# Patient Record
Sex: Male | Born: 1965 | Race: White | Hispanic: No | Marital: Married | Smoking: Never smoker
Health system: Southern US, Community
[De-identification: ages and names within clinical notes are randomized; demographics above are authoritative.]

## PROBLEM LIST (undated history)

## (undated) DIAGNOSIS — R112 Nausea with vomiting, unspecified: Secondary | ICD-10-CM

## (undated) DIAGNOSIS — Z9889 Other specified postprocedural states: Secondary | ICD-10-CM

## (undated) DIAGNOSIS — T7840XA Allergy, unspecified, initial encounter: Secondary | ICD-10-CM

## (undated) HISTORY — DX: Allergy, unspecified, initial encounter: T78.40XA

## (undated) HISTORY — PX: HERNIA REPAIR: SHX51

## (undated) HISTORY — PX: SHOULDER SURGERY: SHX246

## (undated) HISTORY — PX: APPENDECTOMY: SHX54

## (undated) HISTORY — PX: KNEE SURGERY: SHX244

---

## 1997-12-21 ENCOUNTER — Ambulatory Visit (HOSPITAL_COMMUNITY): Admission: RE | Admit: 1997-12-21 | Discharge: 1997-12-21 | Payer: Self-pay | Admitting: Dermatology

## 2000-09-10 ENCOUNTER — Ambulatory Visit (HOSPITAL_COMMUNITY): Admission: RE | Admit: 2000-09-10 | Discharge: 2000-09-10 | Payer: Self-pay | Admitting: Surgery

## 2000-10-30 ENCOUNTER — Encounter: Admission: RE | Admit: 2000-10-30 | Discharge: 2000-10-30 | Payer: Self-pay | Admitting: Infectious Diseases

## 2000-11-01 ENCOUNTER — Encounter: Payer: Self-pay | Admitting: Infectious Diseases

## 2000-11-01 ENCOUNTER — Ambulatory Visit (HOSPITAL_COMMUNITY): Admission: RE | Admit: 2000-11-01 | Discharge: 2000-11-01 | Payer: Self-pay | Admitting: Infectious Diseases

## 2000-11-27 ENCOUNTER — Encounter: Admission: RE | Admit: 2000-11-27 | Discharge: 2000-11-27 | Payer: Self-pay | Admitting: Infectious Diseases

## 2001-07-16 ENCOUNTER — Ambulatory Visit (HOSPITAL_COMMUNITY): Admission: RE | Admit: 2001-07-16 | Discharge: 2001-07-16 | Payer: Self-pay | Admitting: Gastroenterology

## 2012-10-09 ENCOUNTER — Ambulatory Visit: Payer: BC Managed Care – PPO

## 2012-10-09 ENCOUNTER — Ambulatory Visit (INDEPENDENT_AMBULATORY_CARE_PROVIDER_SITE_OTHER): Payer: BC Managed Care – PPO | Admitting: Emergency Medicine

## 2012-10-09 VITALS — BP 130/80 | HR 60 | Temp 98.1°F | Resp 18 | Ht 67.5 in | Wt 168.9 lb

## 2012-10-09 DIAGNOSIS — S139XXA Sprain of joints and ligaments of unspecified parts of neck, initial encounter: Secondary | ICD-10-CM

## 2012-10-09 DIAGNOSIS — S8002XA Contusion of left knee, initial encounter: Secondary | ICD-10-CM

## 2012-10-09 DIAGNOSIS — M25572 Pain in left ankle and joints of left foot: Secondary | ICD-10-CM

## 2012-10-09 DIAGNOSIS — S8000XA Contusion of unspecified knee, initial encounter: Secondary | ICD-10-CM

## 2012-10-09 DIAGNOSIS — M25579 Pain in unspecified ankle and joints of unspecified foot: Secondary | ICD-10-CM

## 2012-10-09 DIAGNOSIS — S335XXA Sprain of ligaments of lumbar spine, initial encounter: Secondary | ICD-10-CM

## 2012-10-09 MED ORDER — NAPROXEN SODIUM 550 MG PO TABS: 550.0000 mg | ORAL_TABLET | Freq: Two times a day (BID) | ORAL | Status: DC

## 2012-10-09 MED ORDER — CYCLOBENZAPRINE HCL 10 MG PO TABS: 10.0000 mg | ORAL_TABLET | Freq: Three times a day (TID) | ORAL | Status: DC | PRN

## 2012-10-09 NOTE — Patient Instructions (Addendum)
Knee Sprain A knee sprain is a tear in one of the strong, fibrous tissues that connect the bones (ligaments) in your knee. The severity of the sprain depends on how much of the ligament is torn. The tear can be either partial or complete. CAUSES  Often, sprains are a result of a fall or injury. The force of the impact causes the fibers of your ligament to stretch too much. This excess tension causes the fibers of your ligament to tear. SYMPTOMS  You may have some loss of motion in your knee. Other symptoms include:  Bruising.  Tenderness.  Swelling. DIAGNOSIS  In order to diagnose knee sprain, your caregiver will physically examine your knee to determine how torn the ligament is. Your caregiver may also suggest an X-ray exam of your knee to make sure no bones are broken. TREATMENT  If your ligament is only partially torn, treatment usually involves keeping the knee in a fixed position (immobilization) or bracing your knee for activities that require movement for several weeks. To do this, your caregiver will apply a bandage, cast, or splint to keep your knee from moving or support your knee during movement until it heals. For a partially torn ligament, the healing process usually takes 4 to 6 weeks. If your ligament is completely torn, depending on which ligament it is, you may need surgery to reconnect the ligament to the bone or reconstruct it. After surgery, a cast or splint may be applied and will need to stay on your knee for 4 to 6 weeks while your ligament heals. HOME CARE INSTRUCTIONS  Keep your injured knee elevated to decrease swelling.  To ease pain and swelling, apply ice to your knee twice a day, for 2 to 3 days:  Put ice in a plastic bag.  Place a towel between your skin and the bag.  Leave the ice on for 15 minutes.  Only take over-the-counter or prescription medicine for pain as directed by your caregiver.  Do not leave your knee unprotected until pain and stiffness go  away (usually 4 to 6 weeks).  Do not allow your cast or splint to get wet. If you have been instructed not to remove it, cover your cast or splint with a plastic bag when you shower or bathe. Do not swim.  Your caregiver may suggest exercises for you to do during your recovery to prevent or limit permanent weakness and stiffness. SEEK IMMEDIATE MEDICAL CARE IF:  Your cast or splint becomes damaged.  Your pain becomes worse. MAKE SURE YOU:  Understand these instructions.  Will watch your condition.  Will get help right away if you are not doing well or get worse. Document Released: 02/19/2005 Document Revised: 05/14/2011 Document Reviewed: 02/03/2011 [facility] Patient Information 2014 [place], [place]. Lumbosacral Strain Lumbosacral strain is one of the most common causes of back pain. There are many causes of back pain. Most are not serious conditions. CAUSES  Your backbone (spinal column) is made up of 24 main vertebral bodies, the sacrum, and the coccyx. These are held together by muscles and tough, fibrous tissue (ligaments). Nerve roots pass through the openings between the vertebrae. A sudden move or injury to the back may cause injury to, or pressure on, these nerves. This may result in localized back pain or pain movement (radiation) into the buttocks, down the leg, and into the foot. Sharp, shooting pain from the buttock down the back of the leg (sciatica) is frequently associated with a ruptured (herniated) disk. Pain  may be caused by muscle spasm alone. Your caregiver can often find the cause of your pain by the details of your symptoms and an exam. In some cases, you may need tests (such as X-rays). Your caregiver will work with you to decide if any tests are needed based on your specific exam. HOME CARE INSTRUCTIONS   Avoid an underactive lifestyle. Active exercise, as directed by your caregiver, is your greatest weapon against back pain.  Avoid hard physical activities (tennis,  racquetball, waterskiing) if you are not in proper physical condition for it. This may aggravate or create problems.  If you have a back problem, avoid sports requiring sudden body movements. Swimming and walking are generally safer activities.  Maintain good posture.  Avoid becoming overweight (obese).  Use bed rest for only the most extreme, sudden (acute) episode. Your caregiver will help you determine how much bed rest is necessary.  For acute conditions, you may put ice on the injured area.  Put ice in a plastic bag.  Place a towel between your skin and the bag.  Leave the ice on for 15-20 minutes at a time, every 2 hours, or as needed.  After you are improved and more active, it may help to apply heat for 30 minutes before activities. See your caregiver if you are having pain that lasts longer than expected. Your caregiver can advise appropriate exercises or therapy if needed. With conditioning, most back problems can be avoided. SEEK IMMEDIATE MEDICAL CARE IF:   You have numbness, tingling, weakness, or problems with the use of your arms or legs.  You experience severe back pain not relieved with medicines.  There is a change in bowel or bladder control.  You have increasing pain in any area of the body, including your belly (abdomen).  You notice shortness of breath, dizziness, or feel faint.  You feel sick to your stomach (nauseous), are throwing up (vomiting), or become sweaty.  You notice discoloration of your toes or legs, or your feet get very cold.  Your back pain is getting worse.  You have a fever. MAKE SURE YOU:   Understand these instructions.  Will watch your condition.  Will get help right away if you are not doing well or get worse. Document Released: 11/29/2004 Document Revised: 05/14/2011 Document Reviewed: 05/21/2008 [facility] Patient Information 2014 [place], [place]. Cervical Sprain A cervical sprain is an injury in the neck in which the  ligaments are stretched or torn. The ligaments are the tissues that hold the bones of the neck (vertebrae) in place.Cervical sprains can range from very mild to very severe. Most cervical sprains get better in 1 to 3 weeks, but it depends on the cause and extent of the injury. Severe cervical sprains can cause the neck vertebrae to be unstable. This can lead to damage of the spinal cord and can result in serious nervous system problems. Your caregiver will determine whether your cervical sprain is mild or severe. CAUSES  Severe cervical sprains may be caused by:  Contact sport injuries (football, rugby, wrestling, hockey, auto racing, gymnastics, diving, martial arts, boxing).  Motor vehicle collisions.  Whiplash injuries. This means the neck is forcefully whipped backward and forward.  Falls. Mild cervical sprains may be caused by:   Awkward positions, such as cradling a telephone between your ear and shoulder.  Sitting in a chair that does not offer proper support.  Working at a poorly Marketing executive station.  Activities that require looking up or down for  long periods of time. SYMPTOMS   Pain, soreness, stiffness, or a burning sensation in the front, back, or sides of the neck. This discomfort may develop immediately after injury or it may develop slowly and not begin for 24 hours or more after an injury.  Pain or tenderness directly in the middle of the back of the neck.  Shoulder or upper back pain.  Limited ability to move the neck.  Headache.  Dizziness.  Weakness, numbness, or tingling in the hands or arms.  Muscle spasms.  Difficulty swallowing or chewing.  Tenderness and swelling of the neck. DIAGNOSIS  Most of the time, your caregiver can diagnose this problem by taking your history and doing a physical exam. Your caregiver will ask about any known problems, such as arthritis in the neck or a previous neck injury. X-rays may be taken to find out if there are  any other problems, such as problems with the bones of the neck. However, an X-ray often does not reveal the full extent of a cervical sprain. Other tests such as a computed tomography (CT) scan or magnetic resonance imaging (MRI) may be needed. TREATMENT  Treatment depends on the severity of the cervical sprain. Mild sprains can be treated with rest, keeping the neck in place (immobilization), and pain medicines. Severe cervical sprains need immediate immobilization and an appointment with an orthopedist or neurosurgeon. Several treatment options are available to help with pain, muscle spasms, and other symptoms. Your caregiver may prescribe:  Medicines, such as pain relievers, numbing medicines, or muscle relaxants.  Physical therapy. This can include stretching exercises, strengthening exercises, and posture training. Exercises and improved posture can help stabilize the neck, strengthen muscles, and help stop symptoms from returning.  A neck collar to be worn for short periods of time. Often, these collars are worn for comfort. However, certain collars may be worn to protect the neck and prevent further worsening of a serious cervical sprain. HOME CARE INSTRUCTIONS   Put ice on the injured area.  Put ice in a plastic bag.  Place a towel between your skin and the bag.  Leave the ice on for 15-20 minutes, 3-4 times a day.  Only take over-the-counter or prescription medicines for pain, discomfort, or fever as directed by your caregiver.  Keep all follow-up appointments as directed by your caregiver.  Keep all physical therapy appointments as directed by your caregiver.  If a neck collar is prescribed, wear it as directed by your caregiver.  Do not drive while wearing a neck collar.  Make any needed adjustments to your work station to promote good posture.  Avoid positions and activities that make your symptoms worse.  Warm up and stretch before being active to help prevent  problems. SEEK MEDICAL CARE IF:   Your pain is not controlled with medicine.  You are unable to decrease your pain medicine over time as planned.  Your activity level is not improving as expected. SEEK IMMEDIATE MEDICAL CARE IF:   You develop any bleeding, stomach upset, or signs of an allergic reaction to your medicine.  Your symptoms get worse.  You develop new, unexplained symptoms.  You have numbness, tingling, weakness, or paralysis in any part of your body. MAKE SURE YOU:   Understand these instructions.  Will watch your condition.  Will get help right away if you are not doing well or get worse. Document Released: 12/17/2006 Document Revised: 05/14/2011 Document Reviewed: 11/22/2010 [facility] Patient Information 2014 [place], [place].
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
## 2013-04-24 ENCOUNTER — Observation Stay (HOSPITAL_COMMUNITY)
Admission: EM | Admit: 2013-04-24 | Discharge: 2013-04-25 | Disposition: A | Discharge status: Home / Self Care | Payer: BC Managed Care – PPO | Attending: Surgery | Admitting: Surgery

## 2013-04-24 ENCOUNTER — Encounter (HOSPITAL_COMMUNITY): Payer: Self-pay | Admitting: Emergency Medicine

## 2013-04-24 ENCOUNTER — Ambulatory Visit (HOSPITAL_COMMUNITY)
Admission: RE | Admit: 2013-04-24 | Discharge: 2013-04-24 | Disposition: A | Discharge status: Home / Self Care | Payer: BC Managed Care – PPO | Source: Ambulatory Visit | Attending: Family Medicine | Admitting: Family Medicine

## 2013-04-24 ENCOUNTER — Ambulatory Visit (INDEPENDENT_AMBULATORY_CARE_PROVIDER_SITE_OTHER): Payer: BC Managed Care – PPO | Admitting: Family Medicine

## 2013-04-24 ENCOUNTER — Encounter (HOSPITAL_COMMUNITY): Payer: BC Managed Care – PPO | Admitting: Certified Registered Nurse Anesthetist

## 2013-04-24 ENCOUNTER — Encounter (HOSPITAL_COMMUNITY)
Admission: EM | Disposition: A | Discharge status: Home / Self Care | Payer: Self-pay | Source: Home / Self Care | Attending: Emergency Medicine

## 2013-04-24 ENCOUNTER — Emergency Department (HOSPITAL_COMMUNITY): Payer: BC Managed Care – PPO | Admitting: Certified Registered Nurse Anesthetist

## 2013-04-24 VITALS — BP 126/78 | HR 82 | Temp 99.3°F | Resp 16 | Ht 68.0 in | Wt 168.0 lb

## 2013-04-24 DIAGNOSIS — Q619 Cystic kidney disease, unspecified: Secondary | ICD-10-CM | POA: Insufficient documentation

## 2013-04-24 DIAGNOSIS — R61 Generalized hyperhidrosis: Secondary | ICD-10-CM | POA: Insufficient documentation

## 2013-04-24 DIAGNOSIS — R1031 Right lower quadrant pain: Secondary | ICD-10-CM

## 2013-04-24 DIAGNOSIS — R110 Nausea: Secondary | ICD-10-CM | POA: Insufficient documentation

## 2013-04-24 DIAGNOSIS — Z9049 Acquired absence of other specified parts of digestive tract: Secondary | ICD-10-CM

## 2013-04-24 DIAGNOSIS — K37 Unspecified appendicitis: Secondary | ICD-10-CM

## 2013-04-24 DIAGNOSIS — R509 Fever, unspecified: Secondary | ICD-10-CM

## 2013-04-24 DIAGNOSIS — K3580 Unspecified acute appendicitis: Principal | ICD-10-CM | POA: Diagnosis present

## 2013-04-24 DIAGNOSIS — R630 Anorexia: Secondary | ICD-10-CM

## 2013-04-24 DIAGNOSIS — M47817 Spondylosis without myelopathy or radiculopathy, lumbosacral region: Secondary | ICD-10-CM | POA: Insufficient documentation

## 2013-04-24 DIAGNOSIS — F17200 Nicotine dependence, unspecified, uncomplicated: Secondary | ICD-10-CM | POA: Insufficient documentation

## 2013-04-24 DIAGNOSIS — Z880 Allergy status to penicillin: Secondary | ICD-10-CM | POA: Insufficient documentation

## 2013-04-24 HISTORY — PX: LAPAROSCOPIC APPENDECTOMY: SHX408

## 2013-04-24 HISTORY — DX: Nausea with vomiting, unspecified: R11.2

## 2013-04-24 HISTORY — DX: Other specified postprocedural states: Z98.890

## 2013-04-24 LAB — POCT CBC
GRANULOCYTE PERCENT: 82 % — AB (ref 37–80)
HEMATOCRIT: 53.9 % — AB (ref 43.5–53.7)
HEMOGLOBIN: 17.1 g/dL (ref 14.1–18.1)
Lymph, poc: 2.1 (ref 0.6–3.4)
MCH, POC: 30.8 pg (ref 27–31.2)
MCHC: 31.7 g/dL — AB (ref 31.8–35.4)
MCV: 96.9 fL (ref 80–97)
MID (cbc): 0.7 (ref 0–0.9)
MPV: 8.8 fL (ref 0–99.8)
POC GRANULOCYTE: 12.8 — AB (ref 2–6.9)
POC LYMPH %: 13.3 % (ref 10–50)
POC MID %: 4.7 %M (ref 0–12)
Platelet Count, POC: 227 10*3/uL (ref 142–424)
RBC: 5.56 M/uL (ref 4.69–6.13)
RDW, POC: 13.3 %
WBC: 15.6 10*3/uL — AB (ref 4.6–10.2)

## 2013-04-24 LAB — CBC WITH DIFFERENTIAL/PLATELET
BASOS ABS: 0 10*3/uL (ref 0.0–0.1)
BASOS PCT: 0 % (ref 0–1)
EOS PCT: 0 % (ref 0–5)
Eosinophils Absolute: 0 10*3/uL (ref 0.0–0.7)
HCT: 46 % (ref 39.0–52.0)
Hemoglobin: 16.1 g/dL (ref 13.0–17.0)
Lymphocytes Relative: 13 % (ref 12–46)
Lymphs Abs: 1.8 10*3/uL (ref 0.7–4.0)
MCH: 31.1 pg (ref 26.0–34.0)
MCHC: 35 g/dL (ref 30.0–36.0)
MCV: 88.8 fL (ref 78.0–100.0)
Monocytes Absolute: 1.2 10*3/uL — ABNORMAL HIGH (ref 0.1–1.0)
Monocytes Relative: 9 % (ref 3–12)
NEUTROS ABS: 11 10*3/uL — AB (ref 1.7–7.7)
Neutrophils Relative %: 78 % — ABNORMAL HIGH (ref 43–77)
PLATELETS: 182 10*3/uL (ref 150–400)
RBC: 5.18 MIL/uL (ref 4.22–5.81)
RDW: 12.2 % (ref 11.5–15.5)
WBC: 14 10*3/uL — ABNORMAL HIGH (ref 4.0–10.5)

## 2013-04-24 LAB — POCT UA - MICROSCOPIC ONLY
BACTERIA, U MICROSCOPIC: NEGATIVE
CASTS, UR, LPF, POC: NEGATIVE
CRYSTALS, UR, HPF, POC: NEGATIVE
MUCUS UA: POSITIVE
Yeast, UA: NEGATIVE

## 2013-04-24 LAB — POCT URINALYSIS DIPSTICK
Glucose, UA: NEGATIVE
Leukocytes, UA: NEGATIVE
Nitrite, UA: NEGATIVE
PH UA: 6.5
PROTEIN UA: 30
SPEC GRAV UA: 1.025
Urobilinogen, UA: 0.2

## 2013-04-24 LAB — BASIC METABOLIC PANEL
BUN: 10 mg/dL (ref 6–23)
CALCIUM: 9.4 mg/dL (ref 8.4–10.5)
CHLORIDE: 94 meq/L — AB (ref 96–112)
CO2: 22 mEq/L (ref 19–32)
Creatinine, Ser: 0.72 mg/dL (ref 0.50–1.35)
GFR calc non Af Amer: >90 mL/min (ref >90)
Glucose, Bld: 103 mg/dL — ABNORMAL HIGH (ref 70–99)
Potassium: 3.9 mEq/L (ref 3.7–5.3)
Sodium: 133 mEq/L — ABNORMAL LOW (ref 137–147)

## 2013-04-24 SURGERY — APPENDECTOMY, LAPAROSCOPIC
Anesthesia: General | Site: Abdomen

## 2013-04-24 MED ORDER — HYDROMORPHONE HCL PF 1 MG/ML IJ SOLN
INTRAMUSCULAR | Status: AC
  Filled 2013-04-24: qty 1

## 2013-04-24 MED ORDER — ONDANSETRON HCL 4 MG/2ML IJ SOLN
INTRAMUSCULAR | Status: AC
  Filled 2013-04-24: qty 2

## 2013-04-24 MED ORDER — IOHEXOL 300 MG/ML  SOLN
100.0000 mL | Freq: Once | INTRAMUSCULAR | Status: AC | PRN
  Administered 2013-04-24: 100 mL via INTRAVENOUS

## 2013-04-24 MED ORDER — NEOSTIGMINE METHYLSULFATE 1 MG/ML IJ SOLN
INTRAMUSCULAR | Status: DC | PRN
  Administered 2013-04-24: 5 mg via INTRAVENOUS

## 2013-04-24 MED ORDER — INFLUENZA VAC SPLIT QUAD 0.5 ML IM SUSP: 0.5000 mL | INTRAMUSCULAR | Status: DC

## 2013-04-24 MED ORDER — MIDAZOLAM HCL 5 MG/5ML IJ SOLN
INTRAMUSCULAR | Status: DC | PRN
  Administered 2013-04-24: 2 mg via INTRAVENOUS

## 2013-04-24 MED ORDER — GLYCOPYRROLATE 0.2 MG/ML IJ SOLN
INTRAMUSCULAR | Status: AC
  Filled 2013-04-24: qty 3

## 2013-04-24 MED ORDER — ONDANSETRON HCL 4 MG/2ML IJ SOLN
INTRAMUSCULAR | Status: DC | PRN
  Administered 2013-04-24: 4 mg via INTRAVENOUS

## 2013-04-24 MED ORDER — HYDROMORPHONE HCL PF 1 MG/ML IJ SOLN
0.2500 mg | INTRAMUSCULAR | Status: DC | PRN
  Administered 2013-04-24: 0.5 mg via INTRAVENOUS

## 2013-04-24 MED ORDER — PROPOFOL 10 MG/ML IV BOLUS
INTRAVENOUS | Status: DC | PRN
  Administered 2013-04-24: 150 mg via INTRAVENOUS

## 2013-04-24 MED ORDER — 0.9 % SODIUM CHLORIDE (POUR BTL) OPTIME
TOPICAL | Status: DC | PRN
  Administered 2013-04-24: 1000 mL

## 2013-04-24 MED ORDER — MIDAZOLAM HCL 2 MG/2ML IJ SOLN
INTRAMUSCULAR | Status: AC
  Filled 2013-04-24: qty 2

## 2013-04-24 MED ORDER — DEXAMETHASONE SODIUM PHOSPHATE 10 MG/ML IJ SOLN
INTRAMUSCULAR | Status: AC
  Filled 2013-04-24: qty 1

## 2013-04-24 MED ORDER — ROCURONIUM BROMIDE 100 MG/10ML IV SOLN
INTRAVENOUS | Status: AC
  Filled 2013-04-24: qty 1

## 2013-04-24 MED ORDER — DEXAMETHASONE SODIUM PHOSPHATE 10 MG/ML IJ SOLN
INTRAMUSCULAR | Status: DC | PRN
  Administered 2013-04-24: 10 mg via INTRAVENOUS

## 2013-04-24 MED ORDER — PROPOFOL 10 MG/ML IV BOLUS
INTRAVENOUS | Status: AC
  Filled 2013-04-24: qty 20

## 2013-04-24 MED ORDER — LIDOCAINE HCL (CARDIAC) 20 MG/ML IV SOLN
INTRAVENOUS | Status: DC | PRN
  Administered 2013-04-24: 100 mg via INTRAVENOUS

## 2013-04-24 MED ORDER — FENTANYL CITRATE 0.05 MG/ML IJ SOLN
INTRAMUSCULAR | Status: AC
  Filled 2013-04-24: qty 5

## 2013-04-24 MED ORDER — FENTANYL CITRATE 0.05 MG/ML IJ SOLN
INTRAMUSCULAR | Status: DC | PRN
  Administered 2013-04-24 (×5): 50 ug via INTRAVENOUS

## 2013-04-24 MED ORDER — LACTATED RINGERS IV SOLN
INTRAVENOUS | Status: DC | PRN
  Administered 2013-04-24 (×2): via INTRAVENOUS

## 2013-04-24 MED ORDER — BUPIVACAINE-EPINEPHRINE 0.25% -1:200000 IJ SOLN
INTRAMUSCULAR | Status: DC | PRN
  Administered 2013-04-24: 20 mL

## 2013-04-24 MED ORDER — NEOSTIGMINE METHYLSULFATE 1 MG/ML IJ SOLN
INTRAMUSCULAR | Status: AC
  Filled 2013-04-24: qty 10

## 2013-04-24 MED ORDER — PROMETHAZINE HCL 25 MG/ML IJ SOLN: 6.2500 mg | INTRAMUSCULAR | Status: DC | PRN

## 2013-04-24 MED ORDER — ROCURONIUM BROMIDE 100 MG/10ML IV SOLN
INTRAVENOUS | Status: DC | PRN
  Administered 2013-04-24: 35 mg via INTRAVENOUS

## 2013-04-24 MED ORDER — SUCCINYLCHOLINE CHLORIDE 20 MG/ML IJ SOLN
INTRAMUSCULAR | Status: DC | PRN
  Administered 2013-04-24: 100 mg via INTRAVENOUS

## 2013-04-24 MED ORDER — LACTATED RINGERS IR SOLN
Status: DC | PRN
  Administered 2013-04-24: 1000 mL

## 2013-04-24 MED ORDER — LIDOCAINE HCL (CARDIAC) 20 MG/ML IV SOLN
INTRAVENOUS | Status: AC
  Filled 2013-04-24: qty 5

## 2013-04-24 MED ORDER — BUPIVACAINE-EPINEPHRINE 0.25% -1:200000 IJ SOLN
INTRAMUSCULAR | Status: AC
  Filled 2013-04-24: qty 1

## 2013-04-24 MED ORDER — SODIUM CHLORIDE 0.9 % IV BOLUS (SEPSIS)
1000.0000 mL | Freq: Once | INTRAVENOUS | Status: AC
  Administered 2013-04-24: 1000 mL via INTRAVENOUS

## 2013-04-24 MED ORDER — GLYCOPYRROLATE 0.2 MG/ML IJ SOLN
INTRAMUSCULAR | Status: DC | PRN
Start: 2013-04-24 — End: 2013-04-24
  Administered 2013-04-24: 0.6 mg via INTRAVENOUS

## 2013-04-24 MED ORDER — SODIUM CHLORIDE 0.9 % IV SOLN
1.0000 g | Freq: Once | INTRAVENOUS | Status: AC
  Administered 2013-04-24: 1 g via INTRAVENOUS
  Filled 2013-04-24: qty 1

## 2013-04-24 SURGICAL SUPPLY — 44 items
APL SKNCLS STERI-STRIP NONHPOA (GAUZE/BANDAGES/DRESSINGS) ×1
APPLIER CLIP ROT 10 11.4 M/L (STAPLE)
APR CLP MED LRG 11.4X10 (STAPLE)
BAG SPEC RTRVL LRG 6X4 10 (ENDOMECHANICALS) ×1
BENZOIN TINCTURE PRP APPL 2/3 (GAUZE/BANDAGES/DRESSINGS) ×2 IMPLANT
CANISTER SUCTION 2500CC (MISCELLANEOUS) ×1 IMPLANT
CLIP APPLIE ROT 10 11.4 M/L (STAPLE) IMPLANT
CUTTER FLEX LINEAR 45M (STAPLE) ×1 IMPLANT
DECANTER SPIKE VIAL GLASS SM (MISCELLANEOUS) ×2 IMPLANT
DRAPE LAPAROSCOPIC ABDOMINAL (DRAPES) ×2 IMPLANT
ELECT REM PT RETURN 9FT ADLT (ELECTROSURGICAL) ×2
ELECTRODE REM PT RTRN 9FT ADLT (ELECTROSURGICAL) ×1 IMPLANT
ENDOLOOP SUT PDS II  0 18 (SUTURE)
ENDOLOOP SUT PDS II 0 18 (SUTURE) IMPLANT
GLOVE BIOGEL PI IND STRL 7.0 (GLOVE) ×1 IMPLANT
GLOVE BIOGEL PI INDICATOR 7.0 (GLOVE) ×1
GLOVE SURG ORTHO 8.0 STRL STRW (GLOVE) ×2 IMPLANT
GLOVE SURG SS PI 6.5 STRL IVOR (GLOVE) ×1 IMPLANT
GOWN STRL REUS W/TWL LRG LVL3 (GOWN DISPOSABLE) ×2 IMPLANT
GOWN STRL REUS W/TWL XL LVL3 (GOWN DISPOSABLE) ×3 IMPLANT
IV LACTATED RINGERS 1000ML (IV SOLUTION) ×1 IMPLANT
KIT BASIN OR (CUSTOM PROCEDURE TRAY) ×2 IMPLANT
MANIFOLD NEPTUNE II (INSTRUMENTS) ×1 IMPLANT
NS IRRIG 1000ML POUR BTL (IV SOLUTION) ×1 IMPLANT
PENCIL BUTTON HOLSTER BLD 10FT (ELECTRODE) IMPLANT
POUCH SPECIMEN RETRIEVAL 10MM (ENDOMECHANICALS) ×1 IMPLANT
RELOAD 45 VASCULAR/THIN (ENDOMECHANICALS) IMPLANT
RELOAD STAPLE 45 2.5 WHT GRN (ENDOMECHANICALS) IMPLANT
RELOAD STAPLE 45 3.5 BLU ETS (ENDOMECHANICALS) IMPLANT
RELOAD STAPLE TA45 3.5 REG BLU (ENDOMECHANICALS) ×2 IMPLANT
SCALPEL HARMONIC ACE (MISCELLANEOUS) ×1 IMPLANT
SET IRRIG TUBING LAPAROSCOPIC (IRRIGATION / IRRIGATOR) ×1 IMPLANT
SOLUTION ANTI FOG 6CC (MISCELLANEOUS) ×2 IMPLANT
STRIP CLOSURE SKIN 1/2X4 (GAUZE/BANDAGES/DRESSINGS) ×2 IMPLANT
SUT MNCRL AB 4-0 PS2 18 (SUTURE) ×2 IMPLANT
TOWEL OR 17X26 10 PK STRL BLUE (TOWEL DISPOSABLE) ×2 IMPLANT
TRAY FOLEY CATH 14FRSI W/METER (CATHETERS) ×1 IMPLANT
TRAY FOLEY CATH 16FRSI W/METER (SET/KITS/TRAYS/PACK) ×1 IMPLANT
TRAY LAP CHOLE (CUSTOM PROCEDURE TRAY) ×2 IMPLANT
TROCAR BLADELESS OPT 5 75 (ENDOMECHANICALS) ×2 IMPLANT
TROCAR XCEL BLUNT TIP 100MML (ENDOMECHANICALS) ×2 IMPLANT
TROCAR XCEL NON-BLD 11X100MML (ENDOMECHANICALS) ×2 IMPLANT
TUBING INSUFFLATION 10FT LAP (TUBING) ×2 IMPLANT
WATER STERILE IRR 1500ML POUR (IV SOLUTION) ×1 IMPLANT

## 2013-04-24 NOTE — Progress Notes (Signed)
Subjective: Patient has been having pain in the right lower quadrant of the abdomen since yesterday with no appetite. He has a low-grade temperature. He tried to go to work and felt sick and came on over here. He is maybe a little nauseated but not vomiting. Bowels have not moved since yesterday morning. No burning with urination. His only abdominal surgery was a umbilical herniorrhaphy. No regular medications. He last ate or drank anything at 12 noon he had a pack of crackers  Objective: Throat clear. Neck supple without nodes. Chest clear. Heart regular without murmurs. Abdomen has bowel sounds present. He points to the McBurney's point area as the point of most of his pain. On palpation he the abdomen is soft, guarded in the right lower quadrant. Only minimal rebound. No hernias. Normal male external genitalia. Digital rectal exam reveals prostate gland to be normal and nontender.   Results for orders placed in visit on 04/24/13  POCT URINALYSIS DIPSTICK      Result Value Ref Range   Color, UA dark yellow     Clarity, UA clear     Glucose, UA neg     Bilirubin, UA small     Ketones, UA >=160     Spec Grav, UA 1.025     Blood, UA trace-intact     pH, UA 6.5     Protein, UA 30     Urobilinogen, UA 0.2     Nitrite, UA neg     Leukocytes, UA Negative    POCT UA - MICROSCOPIC ONLY      Result Value Ref Range   WBC, Ur, HPF, POC 0-1     RBC, urine, microscopic 0-1     Bacteria, U Microscopic neg     Mucus, UA pos     Epithelial cells, urine per micros 0-1     Crystals, Ur, HPF, POC neg     Casts, Ur, LPF, POC neg     Yeast, UA neg    POCT CBC      Result Value Ref Range   WBC 15.6 (*) 4.6 - 10.2 K/uL   Lymph, poc 2.1  0.6 - 3.4   POC LYMPH PERCENT 13.3  10 - 50 %L   MID (cbc) 0.7  0 - 0.9   POC MID % 4.7  0 - 12 %M   POC Granulocyte 12.8 (*) 2 - 6.9   Granulocyte percent 82.0 (*) 37 - 80 %G   RBC 5.56  4.69 - 6.13 M/uL   Hemoglobin 17.1  14.1 - 18.1 g/dL   HCT, POC 16.153.9 (*)  09.643.5 - 53.7 %   MCV 96.9  80 - 97 fL   MCH, POC 30.8  27 - 31.2 pg   MCHC 31.7 (*) 31.8 - 35.4 g/dL   RDW, POC 04.513.3     Platelet Count, POC 227  142 - 424 K/uL   MPV 8.8  0 - 99.8 fL   Assessment: Right lower quadrant abdominal pain with low-grade fever, anorexia, elevated white blood count, and exam consistent with appendicitis.  [facility] Will need to go to the emergency room after the CT scan.

## 2013-04-24 NOTE — Patient Instructions (Addendum)
Do not see anything or drink anything between here in the hospital  After the CT scan we will have to have them send you to the emergency room for further evaluation  Appendicitis Appendicitis is when the appendix is swollen (inflamed). The inflammation can lead to developing a hole (perforation) and a collection of pus (abscess). CAUSES  There is not always an obvious cause of appendicitis. Sometimes it is caused by an obstruction in the appendix. The obstruction can be caused by:  A small, hard, pea-sized ball of stool (fecalith).  Enlarged lymph glands in the appendix. SYMPTOMS   Pain around your belly button (navel) that moves toward your lower right belly (abdomen). The pain can become more severe and sharp as time passes.  Tenderness in the lower right abdomen. Pain gets worse if you cough or make a sudden movement.  Feeling sick to your stomach (nauseous).  Throwing up (vomiting).  Loss of appetite.  Fever.  Constipation.  Diarrhea.  Generally not feeling well. DIAGNOSIS   Physical exam.  Blood tests.  Urine test.  X-rays or a CT scan may confirm the diagnosis. TREATMENT  Once the diagnosis of appendicitis is made, the most common treatment is to remove the appendix as soon as possible. This procedure is called appendectomy. In an open appendectomy, a cut (incision) is made in the lower right abdomen and the appendix is removed. In a laparoscopic appendectomy, usually 3 small incisions are made. Long, thin instruments and a camera tube are used to remove the appendix. Most patients go home in 24 to 48 hours after appendectomy. In some situations, the appendix may have already perforated and an abscess may have formed. The abscess may have a "wall" around it as seen on a CT scan. In this case, a drain may be placed into the abscess to remove fluid, and you may be treated with antibiotic medicines that kill germs. The medicine is given through a tube in your vein (IV).  Once the abscess has resolved, it may or may not be necessary to have an appendectomy. You may need to stay in the hospital longer than 48 hours. Document Released: 02/19/2005 Document Revised: 08/21/2011 Document Reviewed: 05/17/2009 [facility] Patient Information 2014 [place].

## 2013-04-24 NOTE — Transfer of Care (Signed)
Immediate Anesthesia Transfer of Care Note  Patient: Mike Olson  Procedure(s) Performed: Procedure(s) (LRB): APPENDECTOMY LAPAROSCOPIC (N/A)  Patient Location: PACU  Anesthesia Type: General  Level of Consciousness: sedated, patient cooperative and responds to stimulation  Airway & Oxygen Therapy: Patient Spontanous Breathing and Patient connected to face mask oxgen  Post-op Assessment: Report given to PACU RN and Post -op Vital signs reviewed and stable  Post vital signs: Reviewed and stable  Complications: No apparent anesthesia complications

## 2013-04-24 NOTE — ED Notes (Signed)
Pt c/o cramping pain in RLQ since last night. Pt had CT done that showed appendicitis. A&Ox4. Pt c/o diarrhea today.

## 2013-04-24 NOTE — ED Notes (Signed)
Bed: RESB Expected date: 04/24/13 Expected time: 8:15 PM Means of arrival:  Comments: Pt from ct +appenditi

## 2013-04-24 NOTE — Op Note (Signed)
OPERATIVE REPORT - LAPAROSCOPIC APPENDECTOMY  Preop diagnosis: Acute appendicitis  Postop diagnosis: Same  Procedure: Laparoscopic appendectomy  Surgeon:  Velora Hecklerodd M. Fanta Wimberley, MD, FACS  Anesthesia: General endotracheal  Estimated blood loss: Minimal  Preparation: Chlora-prep  Complications: None  Indications:  48 yo WM with 2 day history of abdominal pain and anorexia.  WBC 14K.  CT scan consistent with acute appendicitis.  Procedure:  Patient is brought to the operating room and placed in a supine position on the operating room table. Following administration of general anesthesia, a time out was held and the patient's name and type of procedure is confirmed. Patient is then prepped and draped in the usual strict aseptic fashion.  After ascertaining that an adequate level of anesthesia been achieved, a peri-umbilical incision is made with a #15 blade. Dissection is carried down to the fascia. Fascia is incised in the midline and the peritoneal cavity is entered cautiously. A #0-vicryl pursestring suture is placed in the fascia. An Hassan cannula is introduced under direct vision and secured with the pursestring suture. Abdomen is insufflated with carbon dioxide. Laparoscope is introduced and the abdomen is explored. Operative ports are placed in the right upper quadrant and left lower quadrant. Appendix is identified. Mesoappendix is divided with the harmonic scalpel. Dissection is carried down to the base of the appendix. The base of the appendix at its junction with the cecal wall was clearly identified. Using an Endo GIA stapler the base of the appendix is transected at its junction with the cecal wall. There is good approximation of tissue along the staple line. There is good hemostasis along the staple line. The appendix is placed into an endo-catch bag and withdrawn through the umbilical port without difficulty. The #0-vicryl pursestring suture is tied securely.  Right lower quadrant is  irrigated with warm saline which is evacuated. Good hemostasis is noted. Ports are removed under direct vision. Good hemostasis is noted at the port sites. Pneumoperitoneum is released.  Skin incisions are anesthetized with local anesthetic. Wounds are closed with interrupted 4-0 Monocryl subcuticular sutures. Wounds were washed and dried and benzoin and Steri-Strips are applied. Sterile dressings are applied. Patient is awakened from anesthesia and brought to the recovery room. The patient tolerated the procedure well.  Velora Hecklerodd M. Rozelia Catapano, MD, [facility] Office: [phone]

## 2013-04-24 NOTE — H&P (Signed)
Mike Olson is an 48 y.o. male.    General Surgery [facility]  Chief Complaint: abdominal pain, acute appendicitis  HPI: patient is a 48 year old male with a two-day history of abdominal pain. Patient had onset of general abdominal discomfort and anorexia on the day prior to admission. Patient went to work this morning. Pain localized to the right lower quadrant. He developed nausea. Patient had fever and sweats. He presented to his primary care physician. He was sent to the emergency department for CT scan of abdomen and pelvis. White blood cell count was elevated at 14,000. CT scan shows findings consistent with acute appendicitis.  Prior abdominal surgery includes umbilical hernia repair in 2002 by Dr. Rayburn Ma.  Past Medical History  Diagnosis Date  . Allergy   . PONV (postoperative nausea and vomiting)     Past Surgical History  Procedure Laterality Date  . Shoulder surgery    . Knee surgery Left   . Hernia repair      History reviewed. No pertinent family history. Social History:  reports that he has never smoked. His smokeless tobacco use includes Chew. He reports that he drinks alcohol. He reports that he does not use illicit drugs.  Allergies:  Allergies  Allergen Reactions  . Penicillins Other (See Comments)    unknown     (Not in a hospital admission)  Results for orders placed during the hospital encounter of 04/24/13 (from the past 48 hour(s))  CBC WITH DIFFERENTIAL     Status: Abnormal   Collection Time    04/24/13  8:52 PM      Result Value Ref Range   WBC 14.0 (*) 4.0 - 10.5 K/uL   RBC 5.18  4.22 - 5.81 MIL/uL   Hemoglobin 16.1  13.0 - 17.0 g/dL   HCT 16.1  09.6 - 04.5 %   MCV 88.8  78.0 - 100.0 fL   MCH 31.1  26.0 - 34.0 pg   MCHC 35.0  30.0 - 36.0 g/dL   RDW 40.9  81.1 - 91.4 %   Platelets 182  150 - 400 K/uL   Neutrophils Relative % 78 (*) 43 - 77 %   Neutro Abs 11.0 (*) 1.7 - 7.7 K/uL   Lymphocytes Relative 13  12 - 46  %   Lymphs Abs 1.8  0.7 - 4.0 K/uL   Monocytes Relative 9  3 - 12 %   Monocytes Absolute 1.2 (*) 0.1 - 1.0 K/uL   Eosinophils Relative 0  0 - 5 %   Eosinophils Absolute 0.0  0.0 - 0.7 K/uL   Basophils Relative 0  0 - 1 %   Basophils Absolute 0.0  0.0 - 0.1 K/uL   Ct Abdomen Pelvis W Contrast  04/24/2013   CLINICAL DATA:  Right lower quadrant pain.  Fever  EXAM: CT ABDOMEN AND PELVIS WITH CONTRAST  TECHNIQUE: Multidetector CT imaging of the abdomen and pelvis was performed using the standard protocol following bolus administration of intravenous contrast.  CONTRAST:  OMNIPAQUE IOHEXOL 300 MG/ML  SOLN  COMPARISON:  None  FINDINGS: No pleural effusion identified. The lung bases appear clear. No focal liver abnormality. The gallbladder is normal. No biliary dilatation. Normal appearance of the pancreas. The spleen is unremarkable.  The adrenal glands are both normal. Bilateral renal cysts are identified. The urinary bladder appears normal. Prostate gland and seminal vesicles are unremarkable.  Normal caliber of the abdominal aorta. There is no adenopathy identified within the upper abdomen.  No pelvic or inguinal adenopathy.  The stomach is normal. The small bowel loops are within normal limits. Appendix is thickened measuring 13 mm in thickness. Enhancement of the appendiceal wall is noted and there is periappendiceal fat stranding. The colon is unremarkable.  Review of the visualized osseous structures is significant for mild lumbar spondylosis.  IMPRESSION: 1. Examination compatible with acute appendicitis. No perforation or abscess.   Electronically Signed   By: Signa Kellaylor  Stroud M.D.   On: 04/24/2013 20:06    Review of Systems  Constitutional: Positive for fever and diaphoresis. Negative for chills, weight loss and malaise/fatigue.  HENT: Negative.   Eyes: Negative.   Respiratory: Negative.   Cardiovascular: Negative.   Gastrointestinal: Positive for nausea, abdominal pain (right lower  quadrant) and diarrhea. Negative for vomiting.  Genitourinary: Negative.   Musculoskeletal: Negative.   Skin: Negative.   Neurological: Negative.   Endo/Heme/Allergies: Negative.   Psychiatric/Behavioral: Negative.     Blood pressure 147/90, pulse 78, temperature 98.3 F (36.8 C), temperature source Oral, resp. rate 18, SpO2 98.00%. Physical Exam  Constitutional: He is oriented to person, place, and time. He appears well-developed and well-nourished. No distress.  HENT:  Head: Normocephalic and atraumatic.  Right Ear: External ear normal.  Left Ear: External ear normal.  Eyes: Conjunctivae are normal. No scleral icterus.  Neck: Normal range of motion. Neck supple. No thyromegaly present.  Cardiovascular: Normal rate, regular rhythm and normal heart sounds.   No murmur heard. Respiratory: Effort normal and breath sounds normal. He has no wheezes.  GI: Soft. Bowel sounds are normal. He exhibits no distension and no mass. There is tenderness (right lower quadrant). There is guarding. There is no rebound.  Musculoskeletal: Normal range of motion. He exhibits no edema.  Neurological: He is alert and oriented to person, place, and time.  Skin: Skin is warm and dry.  Psychiatric: He has a normal mood and affect. His behavior is normal.     Assessment/Plan Acute appendicitis  Plan laparoscopic appendectomy  The risks and benefits of the procedure have been discussed at length with the patient.  The patient understands the proposed procedure, potential alternative treatments, and the course of recovery to be expected.  All of the patient's questions have been answered at this time.  The patient wishes to proceed with surgery.  Velora Hecklerodd M. Jerek Meulemans, MD, [facility] Office: [phone]    Dina Warbington Judie PetitM 04/24/2013, 9:44 PM

## 2013-04-24 NOTE — ED Provider Notes (Signed)
CSN: [id number]     Arrival date & time 04/24/13  2022 History   First MD Initiated Contact with Patient 04/24/13 2041     Chief Complaint  Patient presents with  . Positive appendicitis     (Consider location/radiation/quality/duration/timing/severity/associated sxs/prior Treatment) HPI Comments: Patient presents with a two-day history of worsening abdominal pain. He's had some nausea but no vomiting. He has not had a known fevers. He's had decreased appetite. He was seen by his primary care physician earlier today and sent over for a CT scan shows appendicitis. His last by mouth intake was at lunchtime other than the contrast for the CT scan.   Past Medical History  Diagnosis Date  . Allergy   . PONV (postoperative nausea and vomiting)    Past Surgical History  Procedure Laterality Date  . Shoulder surgery    . Knee surgery Left   . Hernia repair     Family History  Problem Relation Age of Onset  . Hypertension Father    History  Substance Use Topics  . Smoking status: Never Smoker   . Smokeless tobacco: Current User    Types: Chew  . Alcohol Use: Yes     Comment: " a couple drinks per night"    Review of Systems  Constitutional: Positive for appetite change. Negative for fever, chills, diaphoresis and fatigue.  HENT: Negative for congestion, rhinorrhea and sneezing.   Eyes: Negative.   Respiratory: Negative for cough, chest tightness and shortness of breath.   Cardiovascular: Negative for chest pain and leg swelling.  Gastrointestinal: Positive for nausea and abdominal pain. Negative for vomiting, diarrhea and blood in stool.  Genitourinary: Negative for frequency, hematuria, flank pain and difficulty urinating.  Musculoskeletal: Negative for arthralgias and back pain.  Skin: Negative for rash.  Neurological: Negative for dizziness, speech difficulty, weakness, numbness and headaches.      Allergies  Penicillins  Home Medications   Current Outpatient Rx   Name  Route  Sig  Dispense  Refill  . cyclobenzaprine (FLEXERIL) 10 MG tablet   Oral   Take 1 tablet (10 mg total) by mouth 3 (three) times daily as needed for muscle spasms.   30 tablet   0   . ibuprofen (ADVIL,MOTRIN) 200 MG tablet   Oral   Take 600 mg by mouth every 6 (six) hours as needed (pain).         . ciprofloxacin (CIPRO) 500 MG tablet   Oral   Take 1 tablet (500 mg total) by mouth 2 (two) times daily.   10 tablet   0   . HYDROcodone-acetaminophen (NORCO/VICODIN) 5-325 MG per tablet   Oral   Take 1-2 tablets by mouth every 4 (four) hours as needed for moderate pain.   30 tablet   0    BP 125/79  Pulse 71  Temp(Src) 98.1 F (36.7 C) (Oral)  Resp 18  Ht 5\' 8"  (1.727 m)  Wt 178 lb 9.2 oz (81 kg)  BMI 27.16 kg/m2  SpO2 96% Physical Exam  Constitutional: He is oriented to person, place, and time. He appears well-developed and well-nourished.  HENT:  Head: Normocephalic and atraumatic.  Eyes: Pupils are equal, round, and reactive to light.  Neck: Normal range of motion. Neck supple.  Cardiovascular: Normal rate, regular rhythm and normal heart sounds.   Pulmonary/Chest: Effort normal and breath sounds normal. No respiratory distress. He has no wheezes. He has no rales. He exhibits no tenderness.  Abdominal: Soft. Bowel sounds are  normal. There is tenderness (moderate tenderness to right lower quadrant. No pain to the inguinal or testicular area). There is no rebound and no guarding.  Musculoskeletal: Normal range of motion. He exhibits no edema.  Lymphadenopathy:    He has no cervical adenopathy.  Neurological: He is alert and oriented to person, place, and time.  Skin: Skin is warm and dry. No rash noted.  Psychiatric: He has a normal mood and affect.    ED Course  Procedures (including critical care time) Labs Review Labs Reviewed  CBC WITH DIFFERENTIAL - Abnormal; Notable for the following:    WBC 14.0 (*)    Neutrophils Relative % 78 (*)    Neutro  Abs 11.0 (*)    Monocytes Absolute 1.2 (*)    All other components within normal limits  BASIC METABOLIC PANEL - Abnormal; Notable for the following:    Sodium 133 (*)    Chloride 94 (*)    Glucose, Bld 103 (*)    All other components within normal limits   Imaging Review Ct Abdomen Pelvis W Contrast  04/24/2013   CLINICAL DATA:  Right lower quadrant pain.  Fever  EXAM: CT ABDOMEN AND PELVIS WITH CONTRAST  TECHNIQUE: Multidetector CT imaging of the abdomen and pelvis was performed using the standard protocol following bolus administration of intravenous contrast.  CONTRAST:  100mL OMNIPAQUE IOHEXOL 300 MG/ML  SOLN  COMPARISON:  None  FINDINGS: No pleural effusion identified. The lung bases appear clear. No focal liver abnormality. The gallbladder is normal. No biliary dilatation. Normal appearance of the pancreas. The spleen is unremarkable.  The adrenal glands are both normal. Bilateral renal cysts are identified. The urinary bladder appears normal. Prostate gland and seminal vesicles are unremarkable.  Normal caliber of the abdominal aorta. There is no adenopathy identified within the upper abdomen. No pelvic or inguinal adenopathy.  The stomach is normal. The small bowel loops are within normal limits. Appendix is thickened measuring 13 mm in thickness. Enhancement of the appendiceal wall is noted and there is periappendiceal fat stranding. The colon is unremarkable.  Review of the visualized osseous structures is significant for mild lumbar spondylosis.  IMPRESSION: 1. Examination compatible with acute appendicitis. No perforation or abscess.   Electronically Signed   By: Signa Kellaylor  Stroud M.D.   On: 04/24/2013 20:06      MDM   Final diagnoses:  Appendicitis    Spoke with surgery on call who will see pt and admit for surgery    Rolan BuccoMelanie Tillman Kazmierski, MD 04/25/13 1734

## 2013-04-24 NOTE — Anesthesia Postprocedure Evaluation (Signed)
  Anesthesia Post-op Note  Patient: Mike Olson  Procedure(s) Performed: Procedure(s): APPENDECTOMY LAPAROSCOPIC (N/A)  Patient Location: PACU  Anesthesia Type:General  Level of Consciousness: awake, alert  and oriented  Airway and Oxygen Therapy: Patient Spontanous Breathing and Patient connected to face mask oxygen  Post-op Pain: mild  Post-op Assessment: Post-op Vital signs reviewed, Patient's Cardiovascular Status Stable, Respiratory Function Stable, Patent Airway and No signs of Nausea or vomiting  Post-op Vital Signs: Reviewed and stable  Complications: No apparent anesthesia complications

## 2013-04-24 NOTE — Anesthesia Preprocedure Evaluation (Signed)
Anesthesia Evaluation  Patient identified by MRN, date of birth, ID band Patient awake    Reviewed: Allergy & Precautions, H&P , NPO status , Patient's Chart, lab work & pertinent test results  History of Anesthesia Complications (+) PONV and history of anesthetic complications  Airway Mallampati: II TM Distance: >3 FB Neck ROM: Full    Dental no notable dental hx.    Pulmonary neg pulmonary ROS,  breath sounds clear to auscultation  Pulmonary exam normal       Cardiovascular Exercise Tolerance: Good negative cardio ROS  Rhythm:Regular Rate:Normal     Neuro/Psych negative neurological ROS  negative psych ROS   GI/Hepatic negative GI ROS, Neg liver ROS,   Endo/Other  negative endocrine ROS  Renal/GU negative Renal ROS  negative genitourinary   Musculoskeletal negative musculoskeletal ROS (+)   Abdominal   Peds negative pediatric ROS (+)  Hematology negative hematology ROS (+)   Anesthesia Other Findings   Reproductive/Obstetrics negative OB ROS                           Anesthesia Physical Anesthesia Plan  ASA: I and emergent  Anesthesia Plan: General   Post-op Pain Management:    Induction: Intravenous  Airway Management Planned: Oral ETT  Additional Equipment:   Intra-op Plan:   Post-operative Plan: Extubation in OR  Informed Consent: I have reviewed the patients History and Physical, chart, labs and discussed the procedure including the risks, benefits and alternatives for the proposed anesthesia with the patient or authorized representative who has indicated his/her understanding and acceptance.   Dental advisory given  Plan Discussed with: CRNA  Anesthesia Plan Comments:         Anesthesia Quick Evaluation

## 2013-04-24 NOTE — Preoperative (Signed)
Beta Blockers   Reason not to administer Beta Blockers:Not Applicable 

## 2013-04-25 ENCOUNTER — Encounter (HOSPITAL_COMMUNITY): Payer: Self-pay | Admitting: *Deleted

## 2013-04-25 DIAGNOSIS — Z9049 Acquired absence of other specified parts of digestive tract: Secondary | ICD-10-CM

## 2013-04-25 MED ORDER — HYDROCODONE-ACETAMINOPHEN 5-325 MG PO TABS: 1.0000 | ORAL_TABLET | ORAL | Status: DC | PRN

## 2013-04-25 MED ORDER — SODIUM CHLORIDE 0.9 % IV SOLN
1.0000 g | INTRAVENOUS | Status: DC
  Administered 2013-04-25: 1 g via INTRAVENOUS
  Filled 2013-04-25: qty 1

## 2013-04-25 MED ORDER — HYDROCODONE-ACETAMINOPHEN 5-325 MG PO TABS
1.0000 | ORAL_TABLET | ORAL | Status: DC | PRN
  Administered 2013-04-25: 1 via ORAL
  Filled 2013-04-25: qty 1

## 2013-04-25 MED ORDER — MORPHINE SULFATE 2 MG/ML IJ SOLN
2.0000 mg | INTRAMUSCULAR | Status: DC | PRN
  Administered 2013-04-25 (×2): 2 mg via INTRAVENOUS
  Filled 2013-04-25 (×2): qty 1

## 2013-04-25 MED ORDER — SODIUM CHLORIDE 0.9 % IV SOLN: 1.0000 g | INTRAVENOUS | Status: DC

## 2013-04-25 MED ORDER — ONDANSETRON HCL 4 MG PO TABS: 4.0000 mg | ORAL_TABLET | Freq: Four times a day (QID) | ORAL | Status: DC | PRN

## 2013-04-25 MED ORDER — ONDANSETRON HCL 4 MG/2ML IJ SOLN: 4.0000 mg | Freq: Four times a day (QID) | INTRAMUSCULAR | Status: DC | PRN

## 2013-04-25 MED ORDER — IBUPROFEN 600 MG PO TABS
600.0000 mg | ORAL_TABLET | Freq: Four times a day (QID) | ORAL | Status: DC | PRN
  Filled 2013-04-25: qty 1

## 2013-04-25 MED ORDER — CIPROFLOXACIN HCL 500 MG PO TABS: 500.0000 mg | ORAL_TABLET | Freq: Two times a day (BID) | ORAL | Status: DC

## 2013-04-25 MED ORDER — KCL IN DEXTROSE-NACL 30-5-0.45 MEQ/L-%-% IV SOLN
INTRAVENOUS | Status: DC
  Administered 2013-04-25: 01:00:00 via INTRAVENOUS
  Filled 2013-04-25 (×2): qty 1000

## 2013-04-25 NOTE — Progress Notes (Signed)
Utilization Review completed.  

## 2013-04-25 NOTE — Discharge Instructions (Signed)
Laparoscopic Appendectomy °Care After °Refer to this sheet in the next few weeks. These instructions provide you with information on caring for yourself after your procedure. Your caregiver may also give you more specific instructions. Your treatment has been planned according to current medical practices, but problems sometimes occur. Call your caregiver if you have any problems or questions after your procedure. °HOME CARE INSTRUCTIONS °· Do not drive while taking narcotic pain medicines. °· Use stool softener if you become constipated from your pain medicines. °· Change your bandages (dressings) as directed. °· Keep your wounds clean and dry. You may wash the wounds gently with soap and water. Gently pat the wounds dry with a clean towel. °· Do not take baths, swim, or use hot tubs for 10 days, or as instructed by your caregiver. °· Only take over-the-counter or prescription medicines for pain, discomfort, or fever as directed by your caregiver. °· You may continue your normal diet as directed. °· Do not lift more than 10 pounds (4.5 kg) or play contact sports for 3 weeks, or as directed. °· Slowly increase your activity after surgery. °· Take deep breaths to avoid getting a lung infection (pneumonia). °SEEK MEDICAL CARE IF: °· You have redness, swelling, or increasing pain in your wounds. °· You have pus coming from your wounds. °· You have drainage from a wound that lasts longer than 1 day. °· You notice a bad smell coming from the wounds or dressing. °· Your wound edges break open after stitches (sutures) have been removed. °· You notice increasing pain in the shoulders (shoulder strap areas) or near your shoulder blades. °· You develop dizzy episodes or fainting while standing. °· You develop shortness of breath. °· You develop persistent nausea or vomiting. °· You cannot control your bowel functions or lose your appetite. °· You develop diarrhea. °SEEK IMMEDIATE MEDICAL CARE IF:  °· You have a fever. °· You  develop a rash. °· You have difficulty breathing or sharp pains in your chest. °· You develop any reaction or side effects to medicines given. °MAKE SURE YOU: °· Understand these instructions. °· Will watch your condition. °· Will get help right away if you are not doing well or get worse. °Document Released: 02/19/2005 Document Revised: 05/14/2011 Document Reviewed: 08/29/2010 °ExitCare® Patient Information ©2014 [employer]. ° °

## 2013-04-25 NOTE — Discharge Summary (Signed)
Physician Discharge Summary  Patient ID: Mike Olson MRN: [id number] DOB/AGE: 1965-08-02 48 y.o.  Admit date: 04/24/2013 Discharge date: 04/25/2013  Admission Diagnoses:  appendicitis  Discharge Diagnoses:  same  Active Problems:   S/P laparoscopic appendectomy Feb 2015   Surgery:  Lap appy  Discharged Condition: improved  Hospital Course:   Had surgery.  Kept overnight.  Able to drink.  Ready for discharge.    Consults: none  Significant Diagnostic Studies: none    Discharge Exam: Blood pressure 120/71, pulse 59, temperature 97.6 F (36.4 C), temperature source Oral, resp. rate 18, height 5\' 8"  (1.727 m), weight 178 lb 9.2 oz (81 kg), SpO2 98.00%. Minimal pain.  Wounds ok.   Disposition: Final discharge disposition not confirmed  Discharge Orders   Future Orders Complete By Expires   Call MD for:  persistant nausea and vomiting  As directed    Call MD for:  temperature >100.4  As directed    Diet - low sodium heart healthy  As directed    Discharge instructions  As directed    Comments:     Shower after discharge   Increase activity slowly  As directed        Medication List         ciprofloxacin 500 MG tablet  Commonly known as:  CIPRO  Take 1 tablet (500 mg total) by mouth 2 (two) times daily.     cyclobenzaprine 10 MG tablet  Commonly known as:  FLEXERIL  Take 1 tablet (10 mg total) by mouth 3 (three) times daily as needed for muscle spasms.     HYDROcodone-acetaminophen 5-325 MG per tablet  Commonly known as:  NORCO/VICODIN  Take 1-2 tablets by mouth every 4 (four) hours as needed for moderate pain.     ibuprofen 200 MG tablet  Commonly known as:  ADVIL,MOTRIN  Take 600 mg by mouth every 6 (six) hours as needed (pain).           Follow-up Information   Follow up with Velora HecklerGERKIN,TODD M, MD In 3 weeks.   Specialty:  General Surgery   Contact information:   [address] [place] [address] [phone]        Signed: Valarie MerinoMARTIN,Kaytlan Behrman B 04/25/2013, 8:53 AM

## 2013-04-25 NOTE — Progress Notes (Signed)
Pt able to walk unit. Tolerated lunch--positive for flatus. Good pain relief with po med. Discussed discharge instructions. Able to answer questions about meds, activity limits and when to call md

## 2013-04-27 ENCOUNTER — Encounter (HOSPITAL_COMMUNITY): Payer: Self-pay | Admitting: Surgery

## 2013-04-27 ENCOUNTER — Ambulatory Visit (HOSPITAL_COMMUNITY)
Admission: RE | Admit: 2013-04-27 | Discharge: 2013-04-27 | Disposition: A | Discharge status: Home / Self Care | Payer: BC Managed Care – PPO | Source: Ambulatory Visit | Attending: Surgery | Admitting: Surgery

## 2013-04-27 ENCOUNTER — Telehealth (INDEPENDENT_AMBULATORY_CARE_PROVIDER_SITE_OTHER): Payer: Self-pay | Admitting: General Surgery

## 2013-04-27 ENCOUNTER — Other Ambulatory Visit (INDEPENDENT_AMBULATORY_CARE_PROVIDER_SITE_OTHER): Payer: Self-pay

## 2013-04-27 DIAGNOSIS — Z9049 Acquired absence of other specified parts of digestive tract: Secondary | ICD-10-CM

## 2013-04-27 DIAGNOSIS — M79609 Pain in unspecified limb: Secondary | ICD-10-CM

## 2013-04-27 DIAGNOSIS — R252 Cramp and spasm: Secondary | ICD-10-CM | POA: Insufficient documentation

## 2013-04-27 DIAGNOSIS — Z9089 Acquired absence of other organs: Secondary | ICD-10-CM | POA: Insufficient documentation

## 2013-04-27 NOTE — Telephone Encounter (Signed)
I spoke with pt and informed him that [facility].  He is going to try and find a ride and will head on over.  I instructed him to enter through the [place] and check in at section [address].  He is agreeable at this time.

## 2013-04-27 NOTE — Telephone Encounter (Signed)
Pt of Dr. Gerrit FriendsGerkin, had lap appe and is now home.  He called to report that he woke up today with Lt calf pain and cramping.  Rates pain as 3 (10 point scale.)  Denies fever, swelling or streaking in calf.  Will ask Dr. Magnus IvanBlackman for advice, as Dr. Gerrit FriendsGerkin is not available this afternoon.

## 2013-04-27 NOTE — Progress Notes (Signed)
VASCULAR LAB PRELIMINARY  PRELIMINARY  PRELIMINARY  PRELIMINARY  Left lower extremity venous duplex completed.    Preliminary report:  Left leg is negative for deep and superficial vein thrombosis.     Marleena Shubert, RVT 04/27/2013, 6:52 PM

## 2013-04-27 NOTE — Telephone Encounter (Signed)
He needs an ultrasound of his calf to r/o DVT

## 2013-04-29 NOTE — Addendum Note (Signed)
Addendum created 04/29/13 2045 by Azell DerBruce J Willford Rabideau, MD   Modules edited: Anesthesia Responsible Staff

## 2013-06-10 ENCOUNTER — Encounter (INDEPENDENT_AMBULATORY_CARE_PROVIDER_SITE_OTHER): Payer: Self-pay | Admitting: Surgery

## 2013-06-10 ENCOUNTER — Ambulatory Visit (INDEPENDENT_AMBULATORY_CARE_PROVIDER_SITE_OTHER): Payer: BC Managed Care – PPO | Admitting: Surgery

## 2013-06-10 VITALS — BP 124/80 | HR 75 | Temp 98.4°F | Ht 69.0 in | Wt 174.0 lb

## 2013-06-10 DIAGNOSIS — Z9049 Acquired absence of other specified parts of digestive tract: Secondary | ICD-10-CM

## 2013-06-10 DIAGNOSIS — Z9889 Other specified postprocedural states: Secondary | ICD-10-CM

## 2013-06-10 NOTE — Progress Notes (Signed)
General Surgery [facility]  Chief Complaint  Patient presents with  . Routine Post Op    lap appendectomy 04/24/2013    HISTORY: Patient is a 48 year old male who underwent laparoscopic appendectomy for acute appendicitis on 04/24/2013. Postoperative course has been uneventful. He has returned to work. He is having minimal discomfort.  EXAM: Surgical wounds are well healed. No sign of herniation. No sign of infection. Right lower quadrant is soft and nontender without mass.  IMPRESSION: Status post laparoscopic appendectomy for acute appendicitis  PLAN: Patient is doing well. He will apply topical creams to his incisions.  Patient is unrestricted in his activity at this point. He will return for surgical care as needed.  Velora Hecklerodd M. Dearl Rudden, MD, FACS General & Endocrine Surgery [facility]   Visit Diagnoses: 1. S/P laparoscopic appendectomy Feb 2015

## 2013-06-10 NOTE — Patient Instructions (Signed)
  CARE OF INCISION   Apply cocoa butter/vitamin E cream (Palmer's brand) to your incision 2 - 3 times daily.  Massage cream into incision for one minute with each application.  Use sunscreen (50 SPF or higher) for first 6 months after surgery if area is exposed to sun.  You may alternate Mederma or other scar reducing cream with cocoa butter cream if desired.       Mike Olson M. Mike Brickner, MD, FACS      [facility]      Office: [phone]    

## 2014-01-06 ENCOUNTER — Ambulatory Visit (INDEPENDENT_AMBULATORY_CARE_PROVIDER_SITE_OTHER): Payer: BC Managed Care – PPO | Admitting: Family Medicine

## 2014-01-06 VITALS — BP 136/102 | HR 84 | Temp 99.5°F | Resp 18 | Ht 68.25 in | Wt 171.0 lb

## 2014-01-06 DIAGNOSIS — R059 Cough, unspecified: Secondary | ICD-10-CM

## 2014-01-06 DIAGNOSIS — J069 Acute upper respiratory infection, unspecified: Secondary | ICD-10-CM

## 2014-01-06 DIAGNOSIS — R05 Cough: Secondary | ICD-10-CM

## 2014-01-06 MED ORDER — HYDROCODONE-HOMATROPINE 5-1.5 MG/5ML PO SYRP: 5.0000 mL | ORAL_SOLUTION | ORAL | Status: DC | PRN

## 2014-01-06 MED ORDER — BENZONATATE 100 MG PO CAPS: 100.0000 mg | ORAL_CAPSULE | Freq: Three times a day (TID) | ORAL | Status: DC | PRN

## 2014-01-06 MED ORDER — AZITHROMYCIN 250 MG PO TABS: ORAL_TABLET | ORAL | Status: DC

## 2014-01-06 NOTE — Patient Instructions (Addendum)
Take over-the-counter antihistamine decongestant such as Claritin-D, Allegra-D, or Zyrtec-D  Take the azithromycin (Z-Pak) 2 pills initially, then 1 daily for 4 days  Take the Tessalon (benzonatate) one or 2 pills 3 times daily as needed for cough. This does not tend to cause a lot of drowsiness.  Take the Hycodan cough syrup 1 teaspoon every 4-6 hours when not working or when trying to sleep. It is a good cough suppressant but does cause drowsiness.  Return as needed

## 2014-01-06 NOTE — Progress Notes (Signed)
Subjective: 48 year old man who is been sick for about 5 days with a respiratory tract infection. He has had upper respiratory congestion with drainage from his nose. He has a little pain in his left ear. He's been coughing a lot. He just doesn't feel good. Intermittently it is felt like he was a little hot but has not documented a fever.  Objective: TMs are normal. His throat is minimally erythematous. Nose congested. Chest is clear to auscultation. Heart regular without murmurs. Without nodes.  Assessment: Upper respiratory infection with secondary bronchitis  Plan: Is been going on long enough that I think we'll go ahead and begin him on an antibiotic though it still could be just viral. Treat symptomatically also.

## 2014-09-24 ENCOUNTER — Ambulatory Visit (INDEPENDENT_AMBULATORY_CARE_PROVIDER_SITE_OTHER): Payer: BLUE CROSS/BLUE SHIELD | Admitting: Family Medicine

## 2014-09-24 VITALS — BP 146/84 | HR 68 | Temp 98.4°F | Resp 16 | Ht 69.0 in | Wt 162.0 lb

## 2014-09-24 DIAGNOSIS — B86 Scabies: Secondary | ICD-10-CM

## 2014-09-24 DIAGNOSIS — H109 Unspecified conjunctivitis: Secondary | ICD-10-CM | POA: Diagnosis not present

## 2014-09-24 MED ORDER — PREDNISONE 20 MG PO TABS: ORAL_TABLET | ORAL | Status: DC

## 2014-09-24 MED ORDER — NEOMYCIN-POLYMYXIN-DEXAMETH 3.5-10000-0.1 OP SUSP: 1.0000 [drp] | Freq: Four times a day (QID) | OPHTHALMIC | Status: DC

## 2014-09-24 MED ORDER — IVERMECTIN 3 MG PO TABS: 12.0000 mg | ORAL_TABLET | Freq: Once | ORAL | Status: DC

## 2014-09-24 NOTE — Progress Notes (Signed)
This chart was scribed for Elvina Sidle, MD by Stann Ore, medical scribe at [facility].The patient was seen in exam room 4 and the patient's care was started at 8:28 AM.  Patient ID: Mike Olson MRN: [id number], DOB: Jul 02, 1965, 49 y.o. Date of Encounter: 09/24/2014  Primary Physician: No PCP Per Patient  Chief Complaint:  Chief Complaint  Patient presents with   Poison Ivy    x 3 weeks    HPI:  Mike Olson is a 49 y.o. male who presents to [facility] complaining of rash from possible poison ivy for about 3 weeks.  Pt states that he went to an urgent care when he first noticed it 2 weeks ago. The rash is on his arms, right upper groin, and behind his neck. He was given a cortisone shot and some prednisone. The rash didn't resolve so he took some more prednisone. Then, he informs that the itchiness was relieved for 2-3 days but recently back.  He was working in his garden. He has not noticed his family members having similar symptoms.   His left eye has some irritation. He went to eye doctor within the last year but didn't see anything.   He works as Theatre manager at [employer].   Past Medical History  Diagnosis Date   Allergy    PONV (postoperative nausea and vomiting)      Home Meds: Prior to Admission medications   Medication Sig Start Date End Date Taking? Authorizing Provider  esomeprazole (NEXIUM) 20 MG capsule Take 20 mg by mouth daily at 12 noon.   Yes Historical Provider, MD  ibuprofen (ADVIL,MOTRIN) 200 MG tablet Take 600 mg by mouth every 6 (six) hours as needed (pain).   Yes Historical Provider, MD    Allergies:  Allergies  Allergen Reactions   Penicillins Other (See Comments)    unknown    History   Social History   Marital Status: Married    Spouse Name: N/A   Number of Children: N/A   Years of Education: N/A   Occupational History   Not on file.   Social History Main  Topics   Smoking status: Never Smoker    Smokeless tobacco: Current User    Types: Chew   Alcohol Use: Yes     Comment: " a couple drinks per night"   Drug Use: No   Sexual Activity: Not on file   Other Topics Concern   Not on file   Social History Narrative     Review of Systems: Constitutional: negative for chills, fever, night sweats, weight changes, or fatigue  HEENT: negative for vision changes, hearing loss, congestion, rhinorrhea, ST, epistaxis, or sinus pressure; positive for eye redness, positive for eye irritation Cardiovascular: negative for chest pain or palpitations Respiratory: negative for hemoptysis, wheezing, shortness of breath, or cough Abdominal: negative for abdominal pain, nausea, vomiting, diarrhea, or constipation Dermatological: positive for rash (arms, right upper groin, behind his neck) Neurologic: negative for headache, dizziness, or syncope All other systems reviewed and are otherwise negative with the exception to those above and in the HPI.  Physical Exam: Blood pressure 146/84, pulse 68, temperature 98.4 F (36.9 C), resp. rate 16, height 5\' 9"  (1.753 m), weight 162 lb (73.483 kg), SpO2 97 %., Body mass index is 23.91 kg/(m^2). General: Well developed, well nourished, in no acute distress. Head: Normocephalic, atraumatic, eyes without discharge, sclera non-icteric, nares are without discharge. Patient does have injection  of the left eye with some mild cobblestoning of the inner conjunctiva and normal fundus Bilateral auditory canals clear, TM's are without perforation, pearly grey and translucent with reflective cone of light bilaterally. Oral cavity moist, posterior pharynx without exudate, erythema, peritonsillar abscess, or post nasal drip.  Neck: Supple. No thyromegaly. Full ROM. No lymphadenopathy. Lungs: Clear bilaterally to auscultation without wheezes, rales, or rhonchi. Breathing is unlabored. Heart: RRR with S1 S2. No murmurs, rubs, or  gallops appreciated. Abdomen: Soft, non-tender, non-distended with normoactive bowel sounds. No hepatomegaly. No rebound/guarding. No obvious abdominal masses. Msk:  Strength and tone normal for age. Extremities/Skin: Warm and dry. No clubbing or cyanosis. No edema. Linear tracks along the inside of his arms and forearms as well as the pubic region. Neuro: Alert and oriented X 3. Moves all extremities spontaneously. Gait is normal. CNII-XII grossly in tact. Psych:  Responds to questions appropriately with a normal affect.     ASSESSMENT AND PLAN:  49 y.o. year old male with  This chart was scribed in my presence and reviewed by me personally.    ICD-9-CM ICD-10-CM   1. Scabies 133.0 B86 ivermectin (STROMECTOL) 3 MG TABS tablet     predniSONE (DELTASONE) 20 MG tablet  2. Conjunctivitis of left eye 372.30 H10.9 neomycin-polymyxin b-dexamethasone (MAXITROL) 3.5-10000-0.1 SUSP     Signed, Elvina Sidle, MD    Signed, Elvina Sidle, MD 09/24/2014 8:28 AM

## 2014-09-24 NOTE — Patient Instructions (Signed)
Scabies Scabies are small bugs (mites) that burrow under the skin and cause red bumps and severe itching. These bugs can only be seen with a microscope. Scabies are highly contagious. They can spread easily from person to person by direct contact. They are also spread through sharing clothing or linens that have the scabies mites living in them. It is not unusual for an entire family to become infected through shared towels, clothing, or bedding.  HOME CARE INSTRUCTIONS   Your caregiver may prescribe a cream or lotion to kill the mites. If cream is prescribed, massage the cream into the entire body from the neck to the bottom of both feet. Also massage the cream into the scalp and face if your child is less than 2 year old. Avoid the eyes and mouth. Do not wash your hands after application.  Leave the cream on for 8 to 12 hours. Your child should bathe or shower after the 8 to 12 hour application period. Sometimes it is helpful to apply the cream to your child right before bedtime.  One treatment is usually effective and will eliminate approximately 95% of infestations. For severe cases, your caregiver may decide to repeat the treatment in 1 week. Everyone in your household should be treated with one application of the cream.  New rashes or burrows should not appear within 24 to 48 hours after successful treatment. However, the itching and rash may last for 2 to 4 weeks after successful treatment. Your caregiver may prescribe a medicine to help with the itching or to help the rash go away more quickly.  Scabies can live on clothing or linens for up to 3 days. All of your child's recently used clothing, towels, stuffed toys, and bed linens should be washed in hot water and then dried in a dryer for at least 20 minutes on high heat. Items that cannot be washed should be enclosed in a plastic bag for at least 3 days.  To help relieve itching, bathe your child in a cool bath or apply cool washcloths to the  affected areas.  Your child may return to school after treatment with the prescribed cream. SEEK MEDICAL CARE IF:   The itching persists longer than 4 weeks after treatment.  The rash spreads or becomes infected. Signs of infection include red blisters or yellow-tan crust. Document Released: 02/19/2005 Document Revised: 05/14/2011 Document Reviewed: 06/30/2008 [facility] Patient Information 2015 [place], [place]. This information is not intended to replace advice given to you by your health care provider. Make sure you discuss any questions you have with your health care provider. Bacterial Conjunctivitis Bacterial conjunctivitis, commonly called pink eye, is an inflammation of the clear membrane that covers the white part of the eye (conjunctiva). The inflammation can also happen on the underside of the eyelids. The blood vessels in the conjunctiva become inflamed, causing the eye to become red or pink. Bacterial conjunctivitis may spread easily from one eye to another and from person to person (contagious).  CAUSES  Bacterial conjunctivitis is caused by bacteria. The bacteria may come from your own skin, your upper respiratory tract, or from someone else with bacterial conjunctivitis. SYMPTOMS  The normally white color of the eye or the underside of the eyelid is usually pink or red. The pink eye is usually associated with irritation, tearing, and some sensitivity to light. Bacterial conjunctivitis is often associated with a thick, yellowish discharge from the eye. The discharge may turn into a crust on the eyelids overnight, which causes  your eyelids to stick together. If a discharge is present, there may also be some blurred vision in the affected eye. DIAGNOSIS  Bacterial conjunctivitis is diagnosed by your caregiver through an eye exam and the symptoms that you report. Your caregiver looks for changes in the surface tissues of your eyes, which may point to the specific type of conjunctivitis. A  sample of any discharge may be collected on a cotton-tip swab if you have a severe case of conjunctivitis, if your cornea is affected, or if you keep getting repeat infections that do not respond to treatment. The sample will be sent to a lab to see if the inflammation is caused by a bacterial infection and to see if the infection will respond to antibiotic medicines. TREATMENT   Bacterial conjunctivitis is treated with antibiotics. Antibiotic eyedrops are most often used. However, antibiotic ointments are also available. Antibiotics pills are sometimes used. Artificial tears or eye washes may ease discomfort. HOME CARE INSTRUCTIONS   To ease discomfort, apply a cool, clean washcloth to your eye for 10-20 minutes, 3-4 times a day.  Gently wipe away any drainage from your eye with a warm, wet washcloth or a cotton ball.  Wash your hands often with soap and water. Use paper towels to dry your hands.  Do not share towels or washcloths. This may spread the infection.  Change or wash your pillowcase every day.  You should not use eye makeup until the infection is gone.  Do not operate machinery or drive if your vision is blurred.  Stop using contact lenses. Ask your caregiver how to sterilize or replace your contacts before using them again. This depends on the type of contact lenses that you use.  When applying medicine to the infected eye, do not touch the edge of your eyelid with the eyedrop bottle or ointment tube. SEEK IMMEDIATE MEDICAL CARE IF:   Your infection has not improved within 3 days after beginning treatment.  You had yellow discharge from your eye and it returns.  You have increased eye pain.  Your eye redness is spreading.  Your vision becomes blurred.  You have a fever or persistent symptoms for more than 2-3 days.  You have a fever and your symptoms suddenly get worse.  You have facial pain, redness, or swelling. MAKE SURE YOU:   Understand these  instructions.  Will watch your condition.  Will get help right away if you are not doing well or get worse. Document Released: 02/19/2005 Document Revised: 07/06/2013 Document Reviewed: 07/23/2011 [facility] Patient Information 2015 [place], [place]. This information is not intended to replace advice given to you by your health care provider. Make sure you discuss any questions you have with your health care provider.

## 2014-10-02 ENCOUNTER — Telehealth: Payer: Self-pay

## 2014-10-02 NOTE — Telephone Encounter (Signed)
Pt was wondering if he should be prescribed another prescription for his scabies? He said that its clearing up and just slight itching but he was worried about it possibly coming back.  Please advise [phone]

## 2014-10-04 NOTE — Telephone Encounter (Signed)
Left detailed VM to not repeat ivermectin, use oral antihistamine for itching, and apply moisturizer after bathing.

## 2014-10-04 NOTE — Telephone Encounter (Signed)
He should not repeat the ivermectin at this point.   Advise OTC oral antihistamine for itching (Allegra, Claritin or Zyrtec). Drink plenty of water. Apply a moisturizer to the skin daily after bathing (more often if able).

## 2014-11-19 ENCOUNTER — Ambulatory Visit (INDEPENDENT_AMBULATORY_CARE_PROVIDER_SITE_OTHER): Payer: BLUE CROSS/BLUE SHIELD | Admitting: Physician Assistant

## 2014-11-19 VITALS — BP 118/76 | HR 67 | Temp 98.5°F | Resp 16 | Ht 69.0 in | Wt 164.2 lb

## 2014-11-19 DIAGNOSIS — J069 Acute upper respiratory infection, unspecified: Secondary | ICD-10-CM | POA: Diagnosis not present

## 2014-11-19 DIAGNOSIS — S39012A Strain of muscle, fascia and tendon of lower back, initial encounter: Secondary | ICD-10-CM

## 2014-11-19 DIAGNOSIS — R1012 Left upper quadrant pain: Secondary | ICD-10-CM

## 2014-11-19 DIAGNOSIS — Z23 Encounter for immunization: Secondary | ICD-10-CM

## 2014-11-19 MED ORDER — GUAIFENESIN ER 1200 MG PO TB12: 1.0000 | ORAL_TABLET | Freq: Two times a day (BID) | ORAL | Status: AC

## 2014-11-19 MED ORDER — IPRATROPIUM BROMIDE 0.06 % NA SOLN: 2.0000 | Freq: Three times a day (TID) | NASAL | Status: DC

## 2014-11-19 NOTE — Progress Notes (Signed)
Mike Olson  MRN: [id number] DOB: 1965/10/22  Subjective:  Pt presents to clinic for multiple complaints 1- presents for evaluation of cold symptoms x 3 days. His symptoms began at work 3 days ago with sinus congestion and a scratchy throat. He thought it was allergies but heard that a virus was going around so wanted to get evaluated. No dysphagia. Cough is productive with yellow/dark sputum. He has pressure around his eyes, forehead, and cheeks. His son had similar symptoms, but with vomiting.  2- abdominal pain in the LUQ for which he is concerned about stomach ulcers. He is going through a divorce and worries that stress is giving him a stomach ulcer. Also has a hx of reflux x 2 years and this is the area that hurts him when he has reflux sxs. He takes generic Nexium (esomeprazole) 20 mg daily for reflux. He has the abdominal pain sometimes with spicy foods, and occasionally will have the pain with normal meals that are not spicy. He cannot identify a specific trigger for the pain - states nothing he knows of that makes it better - it only lasts a few mins at max. The episodes occur approximately 2x/week. Bowel movements have no effect on the pain. He describes the pain as irritating and burning, not worse when he lays down. No emesis or hematemesis. No melena or hematochezia.  3- right and left mid- back pain that occurs mostly at night when he is laying down. He has to roll over to the opposite side in order to reduce the pain, once he rolls over the pain resolves. 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 He describes the pain as irritating/tight feeling like he is stiff. No dysuria, urgency, or frequency. No hematuria.  4- interested in receiving the flu vaccine, but is unsure if he can receive it due to being currently ill.   Patient Active Problem List   Diagnosis Date Noted  . S/P laparoscopic appendectomy Feb  2015 04/25/2013    Current Outpatient Prescriptions on File Prior to Visit  Medication Sig Dispense Refill  . esomeprazole (NEXIUM) 20 MG capsule Take 20 mg by mouth daily at 12 noon.    Marland Kitchen ibuprofen (ADVIL,MOTRIN) 200 MG tablet Take 600 mg by mouth every 6 (six) hours as needed (pain).    Marland Kitchen ivermectin (STROMECTOL) 3 MG TABS tablet Take 4 tablets (12 mg total) by mouth once. (Patient not taking: Reported on 11/19/2014) 4 tablet 0  . neomycin-polymyxin b-dexamethasone (MAXITROL) 3.5-10000-0.1 SUSP Place 1 drop into the left eye every 6 (six) hours. (Patient not taking: Reported on 11/19/2014) 5 mL 0  . predniSONE (DELTASONE) 20 MG tablet Two daily with food (Patient not taking: Reported on 11/19/2014) 10 tablet 0   No current facility-administered medications on file prior to visit.    Allergies  Allergen Reactions  . Penicillins Other (See Comments)    unknown    Review of Systems  HENT: Positive for congestion, rhinorrhea and sore throat.   Respiratory: Positive for cough. Negative for shortness of breath and wheezing.        No h/o asthma  Gastrointestinal: Positive for abdominal pain (LUQ - random intermittent pain). Negative for nausea, vomiting, diarrhea, constipation and blood in stool.  Musculoskeletal: Negative for gait problem.  Neurological: Negative for headaches.   Objective:  BP 118/76 mmHg  Pulse 67  Temp(Src) 98.5 F (36.9 C) (Oral)  Resp 16  Ht 5\' 9"  (1.753 m)  Wt 164 lb 3.2 oz (74.481 kg)  BMI 24.24 kg/m2  SpO2 98%  Physical Exam  Constitutional: He is oriented to person, place, and time and well-developed, well-nourished, and in no distress.     HENT:  Head: Normocephalic and atraumatic.  Right Ear: Hearing, tympanic membrane, external ear and ear canal normal.  Left Ear: Hearing, tympanic membrane, external ear and ear canal normal.  Nose: Mucosal edema (red) present.  Mouth/Throat: Uvula is midline, oropharynx is clear and moist and mucous membranes are  normal.  Eyes: Conjunctivae are normal.  Neck: Normal range of motion.  Cardiovascular: Normal rate, regular rhythm and normal heart sounds.   Pulmonary/Chest: Effort normal and breath sounds normal. He has no wheezes.  Abdominal: There is no hepatosplenomegaly. There is generalized tenderness (discomfort). There is no rebound and no CVA tenderness.  Musculoskeletal:       Thoracic back: He exhibits spasm (bilateral paraspinal muscle speasm - TTP). He exhibits normal range of motion, no tenderness and no bony tenderness.       Back:  Lymphadenopathy:       Head (right side): No tonsillar adenopathy present.       Head (left side): No tonsillar adenopathy present.    He has no cervical adenopathy.       Right: No supraclavicular adenopathy present.       Left: No supraclavicular adenopathy present.  Neurological: He is alert and oriented to person, place, and time. He has normal sensation and normal strength. He displays no weakness. No sensory deficit. Gait normal. Gait normal.  Skin: Skin is warm and dry.  Psychiatric: Mood, memory, affect and judgment normal.    Assessment and Plan :  Flu vaccine need - Plan: Flu Vaccine QUAD 36+ mos IM  Acute URI - Plan: ipratropium (ATROVENT) 0.06 % nasal spray, Guaifenesin (MUCINEX MAXIMUM STRENGTH) 1200 MG TB12 - continue symptomatic treatment at this time.  Left upper quadrant pain - suspect gas - discussed with patient other possibilities but none seem likely at this time - he will pay more close attention to his symptoms and what they are related to etc.  Back strain, initial encounter - watchful waiting - ok to use motrin  But pt to be mindful of stomach issues - he should start stretching  Pt is under a lot of stress with his current divorce proceedings that that is affecting sleep which could be contributing to his above symptoms.  Pt suggested to make an appt for a CPE due to multiple concerns today.  Benny Lennert PA-C  [facility] 11/19/2014 5:20 PM

## 2014-11-19 NOTE — Patient Instructions (Signed)
For your cold - symptom treatment - drink a lot of fluids and use motrin or tylenol for aches and pains  For your stomach pain - monitor and keep track of your symptoms to establish a pattern, continue for Nexium and Add OTC Zantac  as needed for pain  For back pain - stretch, core exercises to strength stomach to prevent pressure on back - take motrin as needed for pain  (please monitor whether this affects your stomach pain)

## 2014-11-19 NOTE — Progress Notes (Signed)
Subjective:     Patient ID: Mike Olson, male   DOB: 1965-06-03, 49 y.o.   MRN: [id number] No PCP Per Patient   Chief Complaint  Patient presents with  . Nasal Congestion    x 3 days  . Sore Throat    x 3 days  . Cough    x 3 days  . possible stomach ulcers  . Back Problem    discomfort at night in lower back  . Immunizations    If he is able to the flu vaccine     HPI Patient presents for evaluation of cold symptoms x 3 days. His symptoms began at work 3 days ago with sinus congestion and a scratchy throat. He thought it was allergies but heard that a virus was going around so wanted to get evaluated. No dysphagia. Cough is productive with yellow/dark sputum. He has pressure around his eyes, forehead, and cheeks. His son had similar symptoms, but with vomiting.   Other complaints include abdominal pain in the LUQ for which he is concerned about stomach ulcers. He is going through a divorce and worries that stress is giving him a stomach ulcer. Also has a hx of reflux x 2 years and this is the area that hurts him when he has reflux sxs. He takes generic Nexium (esomeprazole) 20 mg daily for reflux. He has the abdominal pain with spicy foods, and occasionally will have the pain with normal meals that are not spicy. He cannot identify a specific trigger for the pain. The episodes occur approximately 2x/week. Bowel movements have no effect on the pain. He describes the pain as irritating and burning. No emesis or hematemesis. No melena or hematochezia.    He also would like his right and left flank pain addressed. His pain occurs mostly at night when he is laying down. He has to roll over to the opposite side in order to reduce the pain. 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  He describes the pain as irritating. No dysuria, urgency, or frequency. No hematuria.   He is also interested in receiving the flu  vaccine, but is unsure if he can receive it due to being currently ill.  Review of Systems  Constitutional: Positive for fever (Subjective fever). Negative for chills and appetite change.  HENT: Positive for congestion, rhinorrhea, sinus pressure, sneezing and sore throat. Negative for ear pain, hearing loss and trouble swallowing.   Eyes: Negative for discharge and itching.  Respiratory: Positive for cough.   Cardiovascular: Negative for chest pain.  Gastrointestinal: Positive for abdominal pain (Irritation in the LUQ). Negative for nausea, vomiting, diarrhea, constipation and blood in stool.  Genitourinary: Positive for flank pain. Negative for dysuria, urgency, frequency and hematuria.  Musculoskeletal: Positive for back pain.  Neurological: Positive for dizziness (due to blurry vision when looking at things up close).  Psychiatric/Behavioral: Negative for behavioral problems. The patient is not nervous/anxious.     Patient Active Problem List   Diagnosis Date Noted  . S/P laparoscopic appendectomy Feb 2015 04/25/2013    Prior to Admission medications   Medication Sig Start Date End Date Taking? Authorizing Sayler Mickiewicz  esomeprazole (NEXIUM) 20 MG capsule Take 20 mg by mouth daily at 12 noon.   Yes Historical Everlene Cunning, MD  ibuprofen (ADVIL,MOTRIN) 200 MG tablet Take 600 mg by mouth every 6 (six) hours as needed (pain).   Yes Historical Rodney Wigger, MD  ivermectin (STROMECTOL) 3 MG TABS tablet Take 4 tablets (12 mg total) by mouth once. Patient not taking: Reported on 11/19/2014 09/24/14   Elvina Sidle, MD  neomycin-polymyxin b-dexamethasone (MAXITROL) 3.5-10000-0.1 SUSP Place 1 drop into the left eye every 6 (six) hours. Patient not taking: Reported on 11/19/2014 09/24/14   Elvina Sidle, MD  predniSONE (DELTASONE) 20 MG tablet Two daily with food Patient not taking: Reported on 11/19/2014 09/24/14   Elvina Sidle, MD    Allergies  Allergen Reactions  . Penicillins Other (See  Comments)    unknown        Objective:   Physical Exam  Constitutional: He is oriented to person, place, and time. He appears well-developed and well-nourished.  HENT:  Head: Normocephalic and atraumatic.  Right Ear: Tympanic membrane, external ear and ear canal normal.  Left Ear: Tympanic membrane, external ear and ear canal normal.  Nose: Right sinus exhibits no maxillary sinus tenderness and no frontal sinus tenderness. Left sinus exhibits no maxillary sinus tenderness and no frontal sinus tenderness.  Mouth/Throat: Uvula is midline, oropharynx is clear and moist and mucous membranes are normal. No oropharyngeal exudate.  Erythema of the left nasal turbinates  Eyes: Conjunctivae are normal. Pupils are equal, round, and reactive to light.  Neck: Normal range of motion. Neck supple.  Cardiovascular: Normal rate and regular rhythm.   Pulmonary/Chest: Effort normal and breath sounds normal.  Abdominal: Soft. Bowel sounds are normal. He exhibits no distension and no mass. There is generalized tenderness. There is no rebound, no guarding and no CVA tenderness.  Musculoskeletal:       Arms: Distal extremity strength 5/5.  Lymphadenopathy:    He has no cervical adenopathy.  Neurological: He is alert and oriented to person, place, and time.  Skin: Skin is warm and dry.  Psychiatric: He has a normal mood and affect. His behavior is normal. Thought content normal.     BP 118/76 mmHg  Pulse 67  Temp(Src) 98.5 F (36.9 C) (Oral)  Resp 16  Ht 5\' 9"  (1.753 m)  Wt 164 lb 3.2 oz (74.481 kg)  BMI 24.24 kg/m2  SpO2 98%     Assessment & Plan:  1. Flu vaccine need - Flu Vaccine QUAD 36+ mos IM  2. Acute URI Encouraged rest and fluids. Use Motrin or Tylenol as needed for aches and pains.   - ipratropium (ATROVENT) 0.06 % nasal spray; Place 2 sprays into the nose 3 (three) times daily.  Dispense: 15 mL; Refill: 0 - Guaifenesin (MUCINEX MAXIMUM STRENGTH) 1200 MG TB12; Take 1 tablet  (1,200 mg total) by mouth 2 (two) times daily.  Dispense: 14 each; Refill: 0  3. Left upper quadrant pain Unclear etiology. Encouraged patient to monitor symptoms and keep track of any triggers. Continue Nexium and add in OTC Zantac 150 mg as needed for pain.   4. Back strain, initial encounter Encouraged stretching and core exercises. Motrin prn for pain. Patient counseled to monitor if Motrin affects abdominal pain.     Amber D. Race, PA-S Physician Assistant Student [facility]

## 2014-12-30 ENCOUNTER — Ambulatory Visit (INDEPENDENT_AMBULATORY_CARE_PROVIDER_SITE_OTHER): Payer: BLUE CROSS/BLUE SHIELD | Admitting: Physician Assistant

## 2014-12-30 ENCOUNTER — Encounter: Payer: Self-pay | Admitting: Physician Assistant

## 2014-12-30 VITALS — BP 125/92 | HR 65 | Temp 98.1°F | Resp 16 | Ht 69.0 in | Wt 167.2 lb

## 2014-12-30 DIAGNOSIS — R12 Heartburn: Secondary | ICD-10-CM | POA: Insufficient documentation

## 2014-12-30 DIAGNOSIS — Z23 Encounter for immunization: Secondary | ICD-10-CM

## 2014-12-30 DIAGNOSIS — R030 Elevated blood-pressure reading, without diagnosis of hypertension: Secondary | ICD-10-CM | POA: Diagnosis not present

## 2014-12-30 DIAGNOSIS — Z114 Encounter for screening for human immunodeficiency virus [HIV]: Secondary | ICD-10-CM | POA: Diagnosis not present

## 2014-12-30 DIAGNOSIS — Z130 Encounter for screening for diseases of the blood and blood-forming organs and certain disorders involving the immune mechanism: Secondary | ICD-10-CM

## 2014-12-30 DIAGNOSIS — Z1159 Encounter for screening for other viral diseases: Secondary | ICD-10-CM

## 2014-12-30 DIAGNOSIS — Z1329 Encounter for screening for other suspected endocrine disorder: Secondary | ICD-10-CM | POA: Diagnosis not present

## 2014-12-30 DIAGNOSIS — Z13228 Encounter for screening for other metabolic disorders: Secondary | ICD-10-CM

## 2014-12-30 DIAGNOSIS — Z0000 Encounter for general adult medical examination without abnormal findings: Secondary | ICD-10-CM

## 2014-12-30 DIAGNOSIS — IMO0001 Reserved for inherently not codable concepts without codable children: Secondary | ICD-10-CM

## 2014-12-30 DIAGNOSIS — Z13220 Encounter for screening for lipoid disorders: Secondary | ICD-10-CM

## 2014-12-30 DIAGNOSIS — Z113 Encounter for screening for infections with a predominantly sexual mode of transmission: Secondary | ICD-10-CM | POA: Diagnosis not present

## 2014-12-30 LAB — LIPID PANEL
CHOL/HDL RATIO: 3.3 ratio (ref <=5.0)
Cholesterol: 199 mg/dL (ref 125–200)
HDL: 61 mg/dL (ref >=40)
LDL Cholesterol: 118 mg/dL (ref <130)
TRIGLYCERIDES: 99 mg/dL (ref <150)
VLDL: 20 mg/dL (ref <30)

## 2014-12-30 LAB — COMPLETE METABOLIC PANEL WITH GFR
ALBUMIN: 4.4 g/dL (ref 3.6–5.1)
ALK PHOS: 94 U/L (ref 40–115)
ALT: 30 U/L (ref 9–46)
AST: 29 U/L (ref 10–40)
BUN: 10 mg/dL (ref 7–25)
CHLORIDE: 101 mmol/L (ref 98–110)
CO2: 28 mmol/L (ref 20–31)
Calcium: 9.3 mg/dL (ref 8.6–10.3)
Creat: 0.82 mg/dL (ref 0.60–1.35)
Glucose, Bld: 99 mg/dL (ref 65–99)
POTASSIUM: 4.3 mmol/L (ref 3.5–5.3)
SODIUM: 140 mmol/L (ref 135–146)
Total Bilirubin: 1.1 mg/dL (ref 0.2–1.2)
Total Protein: 7.2 g/dL (ref 6.1–8.1)

## 2014-12-30 LAB — HIV ANTIBODY (ROUTINE TESTING W REFLEX): HIV 1&2 Ab, 4th Generation: NONREACTIVE

## 2014-12-30 LAB — CBC WITH DIFFERENTIAL/PLATELET
Basophils Absolute: 0 10*3/uL (ref 0.0–0.1)
Basophils Relative: 0 % (ref 0–1)
EOS ABS: 0.1 10*3/uL (ref 0.0–0.7)
EOS PCT: 1 % (ref 0–5)
HCT: 52.7 % — ABNORMAL HIGH (ref 39.0–52.0)
Hemoglobin: 18.3 g/dL — ABNORMAL HIGH (ref 13.0–17.0)
Lymphocytes Relative: 22 % (ref 12–46)
Lymphs Abs: 1.3 10*3/uL (ref 0.7–4.0)
MCH: 32 pg (ref 26.0–34.0)
MCHC: 34.7 g/dL (ref 30.0–36.0)
MCV: 92.3 fL (ref 78.0–100.0)
MONOS PCT: 8 % (ref 3–12)
MPV: 10.5 fL (ref 8.6–12.4)
Monocytes Absolute: 0.5 10*3/uL (ref 0.1–1.0)
NEUTROS ABS: 4 10*3/uL (ref 1.7–7.7)
Neutrophils Relative %: 69 % (ref 43–77)
PLATELETS: 207 10*3/uL (ref 150–400)
RBC: 5.71 MIL/uL (ref 4.22–5.81)
RDW: 13 % (ref 11.5–15.5)
WBC: 5.8 10*3/uL (ref 4.0–10.5)

## 2014-12-30 LAB — HEPATITIS B SURFACE ANTIGEN: Hepatitis B Surface Ag: NEGATIVE

## 2014-12-30 LAB — HEPATITIS C ANTIBODY: HCV AB: NEGATIVE

## 2014-12-30 LAB — TSH: TSH: 1.748 u[IU]/mL (ref 0.350–4.500)

## 2014-12-30 MED ORDER — ESOMEPRAZOLE MAGNESIUM 40 MG PO CPDR: 40.0000 mg | DELAYED_RELEASE_CAPSULE | Freq: Every day | ORAL | Status: DC

## 2014-12-30 NOTE — Patient Instructions (Addendum)
Keeping you healthy  Get these tests  Blood pressure- Have your blood pressure checked once a year by your healthcare provider.  Normal blood pressure is 120/80.  Weight- Have your body mass index (BMI) calculated to screen for obesity.  BMI is a measure of body fat based on height and weight. You can also calculate your own BMI at https://www.west-esparza.com/www.nhlbisupport.com/bmi/.  Cholesterol- Have your cholesterol checked regularly starting at age 48, sooner may be necessary if you have diabetes, high blood pressure, if a family member developed heart diseases at an early age or if you smoke.   Chlamydia, HIV, and other sexual transmitted disease- Get screened each year until the age of 48 then within three months of each new sexual partner.  Diabetes- Have your blood sugar checked regularly if you have high blood pressure, high cholesterol, a family history of diabetes or if you are overweight.  Get these vaccines  Flu shot- Every fall.  Tetanus shot- Every 10 years.    Take these steps  Don't smoke- If you do smoke, ask your healthcare provider about quitting. For tips on how to quit, go to www.smokefree.gov or call 1-800-QUIT-NOW.  Be physically active- Exercise 5 days a week for at least 30 minutes.  If you are not already physically active start slow and gradually work up to 30 minutes of moderate physical activity.  Examples of moderate activity include walking briskly, mowing the yard, dancing, swimming bicycling, etc.  Eat a healthy diet- Eat a variety of healthy foods such as fruits, vegetables, low fat milk, low fat cheese, yogurt, lean meats, poultry, fish, beans, tofu, etc.  For more information on healthy eating, go to www.thenutritionsource.org  Drink alcohol in moderation- Limit alcohol intake two drinks or less a day.  Never drink and drive.  Dentist- Brush and floss teeth twice daily; visit your dentis twice a year.  Depression-Your emotional health is as important as your physical  health.  If you're feeling down, losing interest in things you normally enjoy please talk with your healthcare provider.  Gun Safety- If you keep a gun in your home, keep it unloaded and with the safety lock on.  Bullets should be stored separately.  Helmet use- Always wear a helmet when riding a motorcycle, bicycle, rollerblading or skateboarding.  Safe sex- If you may be exposed to a sexually transmitted infection, use a condom  Seat belts- Seat bels can save your life; always wear one.  Smoke/Carbon Monoxide detectors- These detectors need to be installed on the appropriate level of your home.  Replace batteries at least once a year.  Skin Cancer- When out in the sun, cover up and use sunscreen SPF 15 or higher. Violence- If anyone is threatening or hurting you, please tell your healthcare provider.

## 2014-12-30 NOTE — Progress Notes (Signed)
Mike Olson  MRN: [id number] DOB: 07/06/1965  Subjective:  Pt presents to clinic for his CPE.  He has no problems or concerns.  Using OTC nexium and it helps with his heartburn but he really thinks that the double OTC dose works the best.  He was hoping to get a Rx for it to see if his insurance will cover it.  Drinks ETOH nightly but does not think it is a problem nor does anyone else.  In the middle of a divorce - years before wife had been unfaithful and he has no symptoms of STD but he would like to make sure he has nothing.  Last dental exam: 8 months Last vision exam: about a year Vaccinations      Tetanus - today      Flu - today        Patient Active Problem List   Diagnosis Date Noted  . Heartburn 12/30/2014  . S/P laparoscopic appendectomy Feb 2015 04/25/2013    Current Outpatient Prescriptions on File Prior to Visit  Medication Sig Dispense Refill  . ibuprofen (ADVIL,MOTRIN) 200 MG tablet Take 600 mg by mouth every 6 (six) hours as needed (pain).     No current facility-administered medications on file prior to visit.    Allergies  Allergen Reactions  . Penicillins Other (See Comments)    unknown    Social History   Social History  . Marital Status: Married    Spouse Name: N/A  . Number of Children: N/A  . Years of Education: N/A   Social History Main Topics  . Smoking status: Never Smoker   . Smokeless tobacco: Current User    Types: Chew  . Alcohol Use: Yes     Comment: " a couple drinks per night"  . Drug Use: No  . Sexual Activity: No   Other Topics Concern  . None   Social History Narrative   Work - [employer]   Separated -    Children - 2   exercises daily at lunch hours   Eats - not so healthy    Past Surgical History  Procedure Laterality Date  . Shoulder surgery    . Knee surgery Left   . Hernia repair    . Laparoscopic appendectomy N/A 04/24/2013    Procedure: APPENDECTOMY LAPAROSCOPIC;  Surgeon: Velora Hecklerodd M Gerkin, MD;   Location: [facility];  Service: General;  Laterality: N/A;    Family History  Problem Relation Age of Onset  . Hypertension Father   . Hyperlipidemia Father   . Alzheimer's disease Father   . Arthritis Mother     RA    Review of Systems  Constitutional: Negative.   HENT: Negative.   Eyes: Negative.   Respiratory: Negative.   Cardiovascular: Negative.   Gastrointestinal:       Heartburn daily   Endocrine: Negative.   Genitourinary: Negative.   Musculoskeletal: Negative.   Skin: Negative.   Allergic/Immunologic: Negative.   Neurological: Negative.   Hematological: Negative.   Psychiatric/Behavioral: Negative.    Objective:  BP 125/92 mmHg  Pulse 65  Temp(Src) 98.1 F (36.7 C) (Oral)  Resp 16  Ht 5\' 9"  (1.753 m)  Wt 167 lb 3.2 oz (75.841 kg)  BMI 24.68 kg/m2  Physical Exam  Constitutional: He is oriented to person, place, and time and well-developed, well-nourished, and in no distress.  HENT:  Head: Normocephalic and atraumatic.  Right Ear: Hearing, tympanic membrane, external ear and ear canal normal.  Left Ear: Hearing, tympanic membrane, external ear and ear canal normal.  Nose: Nose normal.  Mouth/Throat: Uvula is midline, oropharynx is clear and moist and mucous membranes are normal.  Eyes: Conjunctivae and EOM are normal. Pupils are equal, round, and reactive to light.  Neck: Trachea normal and normal range of motion. Neck supple. No thyroid mass and no thyromegaly present.  Cardiovascular: Normal rate, regular rhythm and normal heart sounds.   No murmur heard. Pulmonary/Chest: Effort normal and breath sounds normal.  Abdominal: Soft. Bowel sounds are normal.  Genitourinary: Testes/scrotum normal and penis normal.  Musculoskeletal: Normal range of motion.  Neurological: He is alert and oriented to person, place, and time. Gait normal.  Skin: Skin is warm and dry.  Psychiatric: Mood, memory, affect and judgment normal.     Visual Acuity Screening   Right  eye Left eye Both eyes  Without correction:  With correction:       Assessment and Plan :  Annual physical exam  Heartburn - Plan: esomeprazole (NEXIUM) 40 MG capsule  Need for Tdap vaccination - Plan: Tdap vaccine greater than or equal to 7yo IM  Screening for deficiency anemia - Plan: CBC with Differential/Platelet  Screening for metabolic disorder - Plan: COMPLETE METABOLIC PANEL WITH GFR  Screening cholesterol level - Plan: Lipid panel  Screening for thyroid disorder - Plan: TSH  Screening for HIV (human immunodeficiency virus) - Plan: HIV antibody  Screening for STD (sexually transmitted disease) - Plan: GC/Chlamydia Probe Amp, RPR, Trichomonas vaginalis, RNA  Need for hepatitis C screening test - Plan: Hepatitis C antibody  Need for hepatitis B screening test - Plan: Hepatitis B surface antigen  Elevated BP  Recheck in 1 month - pt will try and decrease the salt intake in his diet.  At his last visit his BP was normal - he ate fried chicken before he came to his appointment.  Benny Lennert PA-C  [facility] 12/30/2014 4:13 PM

## 2014-12-31 LAB — GC/CHLAMYDIA PROBE AMP
CT Probe RNA: NEGATIVE
GC PROBE AMP APTIMA: NEGATIVE

## 2014-12-31 LAB — RPR

## 2014-12-31 LAB — TRICHOMONAS VAGINALIS, PROBE AMP: T vaginalis RNA: NEGATIVE

## 2015-01-04 ENCOUNTER — Telehealth: Payer: Self-pay

## 2015-01-04 ENCOUNTER — Encounter: Payer: Self-pay | Admitting: Physician Assistant

## 2015-01-04 NOTE — Telephone Encounter (Signed)
[name],  I checked the lab voice messages and Mike Olson was calling to check on his lab results from his last visit.

## 2015-01-05 NOTE — Telephone Encounter (Signed)
Pt calling back today about his lab results.  thanks

## 2015-01-06 NOTE — Telephone Encounter (Signed)
Pt notified via mychart

## 2015-01-06 NOTE — Telephone Encounter (Signed)
Please see letter written 11/1

## 2015-02-17 ENCOUNTER — Encounter: Payer: Self-pay | Admitting: Physician Assistant

## 2015-02-17 ENCOUNTER — Ambulatory Visit (INDEPENDENT_AMBULATORY_CARE_PROVIDER_SITE_OTHER): Payer: BLUE CROSS/BLUE SHIELD | Admitting: Physician Assistant

## 2015-02-17 VITALS — BP 146/96 | HR 56 | Temp 98.7°F | Resp 16 | Ht 68.0 in | Wt 165.0 lb

## 2015-02-17 DIAGNOSIS — H5789 Other specified disorders of eye and adnexa: Secondary | ICD-10-CM

## 2015-02-17 DIAGNOSIS — H578 Other specified disorders of eye and adnexa: Secondary | ICD-10-CM

## 2015-02-17 DIAGNOSIS — I10 Essential (primary) hypertension: Secondary | ICD-10-CM | POA: Diagnosis not present

## 2015-02-17 DIAGNOSIS — R12 Heartburn: Secondary | ICD-10-CM | POA: Diagnosis not present

## 2015-02-17 MED ORDER — LISINOPRIL 5 MG PO TABS
5.0000 mg | ORAL_TABLET | Freq: Every day | ORAL | Status: DC
Start: 2015-02-17 — End: 2015-03-17

## 2015-02-17 MED ORDER — OLOPATADINE HCL 0.2 % OP SOLN: 1.0000 [drp] | Freq: Every day | OPHTHALMIC | Status: DC

## 2015-02-17 MED ORDER — BD ASSURE BPM/AUTO ARM CUFF MISC: 1.0000 | Freq: Every day | Status: DC

## 2015-02-17 NOTE — Progress Notes (Signed)
Mike Olson  MRN: [id number] DOB: [birth year]  Subjective:  Pt presents to clinic for recheck of his BP.  He has not checked it at home.  He has tried to eat a little better and he still has been exercising daily during his lunch hour (he walks).  He is still under a lot of stress with his divorce that is lingering on and on and he wonders if that is why his BP is elevated but he wants to go ahead and start on medication because he is unsure when the divorce will be final.  He has also been having left lower eyelid irritation.  He is having no globe pain or vision trouble or photophobia.  He does not feel like it is a FB.  He has some left of abx drops and he was wondering if he should start using those.  He has some trouble with allergies but he is not using anything for his symptoms.  Patient Active Problem List   Diagnosis Date Noted  . Heartburn 02/17/2015    Current Outpatient Prescriptions on File Prior to Visit  Medication Sig Dispense Refill  . esomeprazole (NEXIUM) 40 MG capsule Take 1 capsule (40 mg total) by mouth daily. 90 capsule 3  . ibuprofen (ADVIL,MOTRIN) 200 MG tablet Take 600 mg by mouth every 6 (six) hours as needed (pain).     No current facility-administered medications on file prior to visit.    Allergies  Allergen Reactions  . Penicillins Other (See Comments)    unknown    Review of Systems  Eyes: Positive for discharge (eye feels sticky but no pus from the eye). Negative for pain, redness, itching and visual disturbance.  Respiratory: Negative for cough and shortness of breath.   Cardiovascular: Negative for chest pain, palpitations and leg swelling.   Objective:  BP 146/96 mmHg  Pulse 56  Temp(Src) 98.7 F (37.1 C)  Resp 16  Ht  (1.727 m)  Wt 165 lb (74.844 kg)  BMI 25.09 kg/m2  Physical Exam  Constitutional: He is oriented to person, place, and time and well-developed, well-nourished, and in no distress.  HENT:  Head: Normocephalic  and atraumatic.  Right Ear: External ear normal.  Left Ear: External ear normal.  Eyes: Conjunctivae, EOM and lids are normal. Pupils are equal, round, and reactive to light. Right eye exhibits no exudate and no hordeolum. No foreign body present in the right eye. Left eye exhibits no exudate and no hordeolum. No foreign body present in the left eye. Right conjunctiva is not injected. Right conjunctiva has no hemorrhage. Left conjunctiva is not injected. Left conjunctiva has no hemorrhage.  Seems the the lower lateral sclera is slightly irritated and swollen on the left compared with the right side  Neck: Normal range of motion.  Cardiovascular: Normal rate, regular rhythm, normal heart sounds and intact distal pulses.   Pulmonary/Chest: Effort normal and breath sounds normal. He has no wheezes.  Musculoskeletal:       Right lower leg: He exhibits no edema.       Left lower leg: He exhibits no edema.  Neurological: He is alert and oriented to person, place, and time. Gait normal.  Skin: Skin is warm and dry.  Psychiatric: Mood, memory, affect and judgment normal.    Assessment and Plan :  Benign essential HTN - Plan: lisinopril (PRINIVIL,ZESTRIL) 5 MG tablet, Blood Pressure Monitoring (B-D ASSURE BPM/AUTO ARM CUFF) MISC - start low dose of medication - pt will  monitor at home and continue to do lifestyle modifications.  He will recheck with me in a month.  Eye irritation - Plan: Olopatadine HCl (PATADAY) 0.2 % SOLN - seems allergic in nature and will treat accordingly  Heartburn controlled on his medication  Benny LennertSarah Weber PA-C  [facility] 02/17/2015 5:24 PM

## 2015-02-17 NOTE — Patient Instructions (Addendum)
We want your BP to be between 110/70 and 135/85  For your eye if the Pataday Rx is to expensive you can get Naphcon over the counter which will do similar inflammation decrease

## 2015-03-11 ENCOUNTER — Encounter: Payer: Self-pay | Admitting: Physician Assistant

## 2015-03-17 ENCOUNTER — Ambulatory Visit (INDEPENDENT_AMBULATORY_CARE_PROVIDER_SITE_OTHER): Payer: BLUE CROSS/BLUE SHIELD | Admitting: Physician Assistant

## 2015-03-17 ENCOUNTER — Encounter: Payer: Self-pay | Admitting: Physician Assistant

## 2015-03-17 VITALS — BP 120/84 | HR 67 | Temp 99.5°F | Resp 16 | Ht 68.5 in | Wt 165.2 lb

## 2015-03-17 DIAGNOSIS — I10 Essential (primary) hypertension: Secondary | ICD-10-CM | POA: Diagnosis not present

## 2015-03-17 MED ORDER — LISINOPRIL 5 MG PO TABS: 5.0000 mg | ORAL_TABLET | Freq: Every day | ORAL | Status: DC

## 2015-03-17 NOTE — Progress Notes (Signed)
   Mike Olson  MRN: [id number] DOB: 06-09-1965  Subjective:  Pt presents to clinic for recheck elevated BP.  He has been checking his BP at home and most of the readings are 130/80s except a day where he had a lot of stress and then it was elevated.  He has found that stress rally aggravates his BP.  He otherwise feels fine.  Patient Active Problem List   Diagnosis Date Noted  . Benign essential HTN 03/17/2015  . Heartburn 02/17/2015    Current Outpatient Prescriptions on File Prior to Visit  Medication Sig Dispense Refill  . esomeprazole (NEXIUM) 40 MG capsule Take 1 capsule (40 mg total) by mouth daily. 90 capsule 3  . ibuprofen (ADVIL,MOTRIN) 200 MG tablet Take 600 mg by mouth every 6 (six) hours as needed (pain).    . Olopatadine HCl (PATADAY) 0.2 % SOLN Apply 1 drop to eye daily. 2.5 mL 0  . Blood Pressure Monitoring (B-D ASSURE BPM/AUTO ARM CUFF) MISC 1 Device by Does not apply route daily. (Patient not taking: Reported on 03/17/2015) 1 each 0   No current facility-administered medications on file prior to visit.    Allergies  Allergen Reactions  . Penicillins Other (See Comments)    unknown    Review of Systems  Respiratory: Negative for cough and shortness of breath.   Cardiovascular: Negative for chest pain, palpitations and leg swelling.   Objective:  BP 120/84 mmHg  Pulse 67  Temp(Src) 99.5 F (37.5 C) (Oral)  Resp 16  Ht 5' 8.5" (1.74 m)  Wt 165 lb 3.2 oz (74.934 kg)  BMI 24.75 kg/m2  SpO2 98%  Physical Exam  Constitutional: He is oriented to person, place, and time and well-developed, well-nourished, and in no distress.  HENT:  Head: Normocephalic and atraumatic.  Right Ear: External ear normal.  Left Ear: External ear normal.  Eyes: Conjunctivae are normal.  Neck: Normal range of motion.  Cardiovascular: Normal rate, regular rhythm, normal heart sounds and intact distal pulses.   Pulmonary/Chest: Effort normal and breath sounds normal. He has  no wheezes.  Musculoskeletal:       Right lower leg: He exhibits no edema.       Left lower leg: He exhibits no edema.  Neurological: He is alert and oriented to person, place, and time. Gait normal.  Skin: Skin is warm and dry.  Psychiatric: Mood, memory, affect and judgment normal.    Assessment and Plan :  Benign essential HTN - Plan: lisinopril (PRINIVIL,ZESTRIL) 5 MG tablet   Pt to continue his medication and continue to monitor his BP.  He hopes his divorce is soon final and his stress will be much reduced.  Benny LennertSarah Nailea Whitehorn PA-C  [facility] 03/17/2015 3:58 PM

## 2015-03-17 NOTE — Patient Instructions (Signed)
Keep watching your BP and let me know if you need me sooner.

## 2015-11-09 ENCOUNTER — Other Ambulatory Visit: Payer: Self-pay

## 2015-11-09 DIAGNOSIS — I10 Essential (primary) hypertension: Secondary | ICD-10-CM

## 2015-11-09 MED ORDER — LISINOPRIL 5 MG PO TABS: 5.0000 mg | ORAL_TABLET | Freq: Every day | ORAL | 0 refills | Status: DC

## 2016-01-10 ENCOUNTER — Other Ambulatory Visit: Payer: Self-pay | Admitting: Physician Assistant

## 2016-01-10 DIAGNOSIS — I10 Essential (primary) hypertension: Secondary | ICD-10-CM

## 2016-02-02 ENCOUNTER — Other Ambulatory Visit: Payer: Self-pay | Admitting: Physician Assistant

## 2016-02-02 DIAGNOSIS — R12 Heartburn: Secondary | ICD-10-CM

## 2016-02-05 NOTE — Telephone Encounter (Signed)
03/2015 last ov needs ov

## 2016-02-15 DIAGNOSIS — H5210 Myopia, unspecified eye: Secondary | ICD-10-CM | POA: Diagnosis not present

## 2016-04-18 ENCOUNTER — Other Ambulatory Visit: Payer: Self-pay | Admitting: Physician Assistant

## 2016-04-18 DIAGNOSIS — R12 Heartburn: Secondary | ICD-10-CM

## 2016-05-17 ENCOUNTER — Telehealth: Payer: Self-pay | Admitting: Family Medicine

## 2016-05-17 DIAGNOSIS — I10 Essential (primary) hypertension: Secondary | ICD-10-CM

## 2016-05-17 MED ORDER — LISINOPRIL 5 MG PO TABS: 5.0000 mg | ORAL_TABLET | Freq: Every day | ORAL | 0 refills | Status: DC

## 2016-05-17 NOTE — Telephone Encounter (Signed)
Pt contacted and informed that I gave him a refill for 15 tablets of lisinopril 5mg  to [facility] [place], [place].

## 2016-05-17 NOTE — Telephone Encounter (Signed)
Please send medicine to [facility] in [place]

## 2016-05-17 NOTE — Telephone Encounter (Signed)
Pt would like a few pills of his BP medicine Lisinopril 5 mg until is next appointment date which is 05-29-16

## 2016-05-23 ENCOUNTER — Ambulatory Visit (INDEPENDENT_AMBULATORY_CARE_PROVIDER_SITE_OTHER): Payer: BLUE CROSS/BLUE SHIELD | Admitting: Physician Assistant

## 2016-05-23 ENCOUNTER — Other Ambulatory Visit: Payer: Self-pay | Admitting: Physician Assistant

## 2016-05-23 VITALS — BP 130/80 | HR 85 | Temp 97.7°F | Resp 18 | Ht 68.11 in | Wt 161.4 lb

## 2016-05-23 DIAGNOSIS — I10 Essential (primary) hypertension: Secondary | ICD-10-CM

## 2016-05-23 DIAGNOSIS — Z131 Encounter for screening for diabetes mellitus: Secondary | ICD-10-CM | POA: Diagnosis not present

## 2016-05-23 DIAGNOSIS — Z130 Encounter for screening for diseases of the blood and blood-forming organs and certain disorders involving the immune mechanism: Secondary | ICD-10-CM

## 2016-05-23 DIAGNOSIS — Z13220 Encounter for screening for lipoid disorders: Secondary | ICD-10-CM

## 2016-05-23 DIAGNOSIS — Z0000 Encounter for general adult medical examination without abnormal findings: Secondary | ICD-10-CM | POA: Diagnosis not present

## 2016-05-23 DIAGNOSIS — M546 Pain in thoracic spine: Secondary | ICD-10-CM | POA: Diagnosis not present

## 2016-05-23 MED ORDER — CYCLOBENZAPRINE HCL 5 MG PO TABS: 5.0000 mg | ORAL_TABLET | Freq: Every evening | ORAL | 1 refills | Status: DC | PRN

## 2016-05-23 NOTE — Patient Instructions (Addendum)
Continue your reduction in smokeless tobacco, get at least 30 minutes of exercise a day. Remember to fast next time for your annual physical. Schedule your dentist appointment and keep up the good work!   IF you received an x-ray today, you will receive an invoice from [facility]. Please contact [facility] at [phone] with questions or concerns regarding your invoice.   IF you received labwork today, you will receive an invoice from [facility]. Please contact [facility] at [phone] with questions or concerns regarding your invoice.   Our billing staff will not be able to assist you with questions regarding bills from these companies.  You will be contacted with the lab results as soon as they are available. The fastest way to get your results is to activate your My Chart account. Instructions are located on the last page of this paperwork. If you have not heard from us regarding the results in 2 weeks, please contact this office.     Health Maintenance, Male A healthy lifestyle and preventive care is important for your health and wellness. Ask your health care provider about what schedule of regular examinations is right for you. What should I know about weight and diet?  Eat a Healthy Diet  Eat plenty of vegetables, fruits, whole grains, low-fat dairy products, and lean protein.  Do not eat a lot of foods high in solid fats, added sugars, or salt. Maintain a Healthy Weight  Regular exercise can help you achieve or maintain a healthy weight. You should:  Do at least 150 minutes of exercise each week. The exercise should increase your heart rate and make you sweat (moderate-intensity exercise).  Do strength-training exercises at least twice a week. Watch Your Levels of Cholesterol and Blood Lipids  Have your blood tested for lipids and cholesterol every 5 years starting at 50 years of age. If you are at high risk for heart disease, you should start having your  blood tested when you are 50 years old. You may need to have your cholesterol levels checked more often if:  Your lipid or cholesterol levels are high.  You are older than 50 years of age.  You are at high risk for heart disease. What should I know about cancer screening? Many types of cancers can be detected early and may often be prevented. Lung Cancer  You should be screened every year for lung cancer if:  You are a current smoker who has smoked for at least 30 years.  You are a former smoker who has quit within the past 15 years.  Talk to your health care provider about your screening options, when you should start screening, and how often you should be screened. Colorectal Cancer  Routine colorectal cancer screening usually begins at 50 years of age and should be repeated every 5-10 years until you are 50 years old. You may need to be screened more often if early forms of precancerous polyps or small growths are found. Your health care provider may recommend screening at an earlier age if you have risk factors for colon cancer.  Your health care provider may recommend using home test kits to check for hidden blood in the stool.  A small camera at the end of a tube can be used to examine your colon (sigmoidoscopy or colonoscopy). This checks for the earliest forms of colorectal cancer. Prostate and Testicular Cancer  Depending on your age and overall health, your health care provider may do certain tests to screen for prostate  and testicular cancer.  Talk to your health care provider about any symptoms or concerns you have about testicular or prostate cancer. Skin Cancer  Check your skin from head to toe regularly.  Tell your health care provider about any new moles or changes in moles, especially if:  There is a change in a mole's size, shape, or color.  You have a mole that is larger than a pencil eraser.  Always use sunscreen. Apply sunscreen liberally and repeat  throughout the day.  Protect yourself by wearing long sleeves, pants, a wide-brimmed hat, and sunglasses when outside. What should I know about heart disease, diabetes, and high blood pressure?  If you are 73-65 years of age, have your blood pressure checked every 3-5 years. If you are 30 years of age or older, have your blood pressure checked every year. You should have your blood pressure measured twice-once when you are at a hospital or clinic, and once when you are not at a hospital or clinic. Record the average of the two measurements. To check your blood pressure when you are not at a hospital or clinic, you can use:  An automated blood pressure machine at a pharmacy.  A home blood pressure monitor.  Talk to your health care provider about your target blood pressure.  If you are between 21-73 years old, ask your health care provider if you should take aspirin to prevent heart disease.  Have regular diabetes screenings by checking your fasting blood sugar level.  If you are at a normal weight and have a low risk for diabetes, have this test once every three years after the age of 19.  If you are overweight and have a high risk for diabetes, consider being tested at a younger age or more often.  A one-time screening for abdominal aortic aneurysm (AAA) by ultrasound is recommended for men aged 65-75 years who are current or former smokers. What should I know about preventing infection? Hepatitis B  If you have a higher risk for hepatitis B, you should be screened for this virus. Talk with your health care provider to find out if you are at risk for hepatitis B infection. Hepatitis C  Blood testing is recommended for:  Everyone born from [birth year] through 1964.  Anyone with known risk factors for hepatitis C. Sexually Transmitted Diseases (STDs)  You should be screened each year for STDs including gonorrhea and chlamydia if:  You are sexually active and are younger than 50 years of  age.  You are older than 50 years of age and your health care provider tells you that you are at risk for this type of infection.  Your sexual activity has changed since you were last screened and you are at an increased risk for chlamydia or gonorrhea. Ask your health care provider if you are at risk.  Talk with your health care provider about whether you are at high risk of being infected with HIV. Your health care provider may recommend a prescription medicine to help prevent HIV infection. What else can I do?  Schedule regular health, dental, and eye exams.  Stay current with your vaccines (immunizations).  Do not use any tobacco products, such as cigarettes, chewing tobacco, and e-cigarettes. If you need help quitting, ask your health care provider.  Limit alcohol intake to no more than 2 drinks per day. One drink equals 12 ounces of beer, 5 ounces of wine, or 1 ounces of hard liquor.  Do not use street drugs.  Do not share needles.  Ask your health care provider for help if you need support or information about quitting drugs.  Tell your health care provider if you often feel depressed.  Tell your health care provider if you have ever been abused or do not feel safe at home. This information is not intended to replace advice given to you by your health care provider. Make sure you discuss any questions you have with your health care provider. Document Released: 08/18/2007 Document Revised: 10/19/2015 Document Reviewed: 11/23/2014 Elsevier Interactive Patient Education  2017 [employer].

## 2016-05-23 NOTE — Progress Notes (Signed)
Patient ID: Mike Olson, male    DOB: [birth year], 51 y.o.   MRN: [id number]  PCP: Virgilio Belling  Chief Complaint  Patient presents with  . Annual Exam    back pain on left side   . Flu Vaccine    Subjective:   Presents for [name] Visit. Back pain on left side as well as right shoudler, insidious onset about a month ago. Ibuprofen last night, 400mg  after he woke up from sleep with pain. Pain rated a 5/10 when its at its worst. More sharp than dull, twisting certain ways is very sharp, otherwise its just aggravating.    [employer] BP monitoring, usually runs around 130/80  Last Dentist: Over a year and a half Last Eye Doctor: Within the last 3 months  Influenza: Declined  Patient Active Problem List   Diagnosis Date Noted  . Benign essential HTN 03/17/2015  . Heartburn 02/17/2015    Past Medical History:  Diagnosis Date  . Allergy   . PONV (postoperative nausea and vomiting)      Prior to Admission medications   Medication Sig Start Date End Date Taking? Authorizing Provider  Blood Pressure Monitoring (B-D ASSURE BPM/AUTO ARM CUFF) MISC 1 Device by Does not apply route daily. Patient not taking: Reported on 03/17/2015 02/17/15   Morrell Riddle, PA-C  esomeprazole (NEXIUM) 40 MG capsule TAKE 1 CAPSULE (40 MG TOTAL) BY MOUTH DAILY. 04/19/16   Morrell Riddle, PA-C  ibuprofen (ADVIL,MOTRIN) 200 MG tablet Take 600 mg by mouth every 6 (six) hours as needed (pain).    Historical Provider, MD  lisinopril (PRINIVIL,ZESTRIL) 5 MG tablet Take 1 tablet (5 mg total) by mouth daily. 05/17/16   Magdalene River, PA-C           Allergies  Allergen Reactions  . Penicillins Other (See Comments)    unknown    Past Surgical History:  Procedure Laterality Date  . APPENDECTOMY    . HERNIA REPAIR    . KNEE SURGERY Left   . LAPAROSCOPIC APPENDECTOMY N/A 04/24/2013   Procedure: APPENDECTOMY LAPAROSCOPIC;  Surgeon: Velora Heckler, MD;  Location: [facility];  Service: General;   Laterality: N/A;  . SHOULDER SURGERY      Family History  Problem Relation Age of Onset  . Hypertension Father   . Hyperlipidemia Father   . Alzheimer's disease Father   . Parkinson's disease Father   . Arthritis Mother     RA    Social History   Social History  . Marital status: Married    Spouse name: N/A  . Number of children: 2  . Years of education: 12   Occupational History  . 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 Smoking status: Never Smoker  . Smokeless tobacco: Current User    Types: Chew  . Alcohol use Yes     Comment: " a couple drinks per night"  . Drug use: No  . Sexual activity: Yes    Partners: Female   Other Topics Concern  . None   Social History Narrative   Work - [employer]   [employer]-    Children - 2   Exercises daily at lunch hours, usually walks a few miles   [employer] - Eats fired food occasionally but mostly grilled foods and salads.    Review of Systems  Musculoskeletal: Positive for back pain and myalgias.  All other systems reviewed and are negative.  Objective:  Physical Exam  Constitutional: He is oriented to person, place, and time. Vital signs are normal. He appears well-developed and well-nourished. He is active.  Blood pressure 130/80, pulse 85, temperature 97.7 F (36.5 C), temperature source Oral, resp. rate 18, height 5' 8.11" (1.73 m), weight 161 lb 6.4 oz (73.2 kg), SpO2 96 %.  HENT:  Head: Normocephalic.  Right Ear: Hearing, tympanic membrane, external ear and ear canal normal.  Left Ear: Hearing, tympanic membrane, external ear and ear canal normal.  Nose: Nose normal.  Mouth/Throat: Uvula is midline, oropharynx is clear and moist and mucous membranes are normal.  Eyes: Conjunctivae, EOM and lids are normal. Pupils are equal, round, and reactive to light.  Neck: Trachea normal.  Cardiovascular: Normal rate, regular rhythm and normal heart sounds.   Pulmonary/Chest: Effort normal and breath sounds  normal.  Abdominal: Soft. Normal appearance and bowel sounds are normal. Hernia confirmed negative in the right inguinal area and confirmed negative in the left inguinal area.  Genitourinary: Testes normal and penis normal. Circumcised.  Musculoskeletal:       Right shoulder: He exhibits normal range of motion, no tenderness, no bony tenderness, no swelling, no crepitus and normal strength.       Thoracic back: He exhibits pain. He exhibits normal range of motion and no tenderness.       Back:       Arms: Neurological: He is alert and oriented to person, place, and time. He has normal strength.  Reflex Scores:      Tricep reflexes are 2+ on the right side and 2+ on the left side.      Brachioradialis reflexes are 2+ on the right side and 2+ on the left side.      Patellar reflexes are 2+ on the right side and 2+ on the left side.      Achilles reflexes are 2+ on the right side and 2+ on the left side. Skin: Skin is warm and dry.  Psychiatric: He has a normal mood and affect. His speech is normal and behavior is normal. Judgment and thought content normal. Cognition and memory are normal.       ASCVD (Atherosclerotic Cardiovascular Disease) 2013 Risk Calculator from AHA/ACC from StatOfficial.co.zaMDCalc.com  on 05/23/2016 ** All calculations should be rechecked by clinician prior to use **  RESULT SUMMARY: No statin recommended because 10-year risk <5%; always encourage healthy cardiovascular lifestyle choices. Some patients with other high risk features may still be appropriate for treatment.  3.0%   Risk of cardiovascular event (coronary or stroke death or non-fatal MI or stroke) in next 10 years. 1.9%  10-year cardiovascular risk if risk factors were optimal.  INPUTS: Age -> 49 years Diabetes -> 0 = No Sex -> 1 = Male Race -> 1 = White Smoker -> 0 = No Total cholesterol -> 199 mg/dL HDL cholesterol -> 61 mg/dL Systolic blood pressure -> 130 mm Hg Treatment for hypertension -> 1 =  Yes   Assessment & Plan:  1. Encounter for Medicare annual wellness exam - Comprehensive metabolic panel  2. Benign essential HTN - lisinopril (PRINIVIL,ZESTRIL) 5 MG tablet; Take 1 tablet (5 mg total) by mouth daily.  Dispense: 90 tablet; Refill: 1  3. Screening for hyperlipidemia - Lipid panel  4. Screening, anemia, deficiency, iron - CBC with Differential/Platelet  5. Screening for diabetes mellitus (DM)  6. Left-sided thoracic back pain, unspecified chronicity - cyclobenzaprine (FLEXERIL) 5 MG tablet; Take 1-2 tablets (5-10 mg total)  by mouth at bedtime as needed for muscle spasms.  Dispense: 20 tablet; Refill: 1

## 2016-05-24 LAB — CBC WITH DIFFERENTIAL/PLATELET
BASOS: 0 %
Basophils Absolute: 0 10*3/uL (ref 0.0–0.2)
EOS (ABSOLUTE): 0.1 10*3/uL (ref 0.0–0.4)
Eos: 1 %
Hematocrit: 48.9 % (ref 37.5–51.0)
Hemoglobin: 16.9 g/dL (ref 13.0–17.7)
IMMATURE GRANS (ABS): 0 10*3/uL (ref 0.0–0.1)
IMMATURE GRANULOCYTES: 0 %
LYMPHS: 31 %
Lymphocytes Absolute: 1.6 10*3/uL (ref 0.7–3.1)
MCH: 30.7 pg (ref 26.6–33.0)
MCHC: 34.6 g/dL (ref 31.5–35.7)
MCV: 89 fL (ref 79–97)
Monocytes Absolute: 0.4 10*3/uL (ref 0.1–0.9)
Monocytes: 9 %
NEUTROS PCT: 59 %
Neutrophils Absolute: 3 10*3/uL (ref 1.4–7.0)
Platelets: 204 10*3/uL (ref 150–379)
RBC: 5.5 x10E6/uL (ref 4.14–5.80)
RDW: 13.1 % (ref 12.3–15.4)
WBC: 5.1 10*3/uL (ref 3.4–10.8)

## 2016-05-24 LAB — COMPREHENSIVE METABOLIC PANEL
A/G RATIO: 1.8 (ref 1.2–2.2)
ALT: 24 IU/L (ref 0–44)
AST: 23 IU/L (ref 0–40)
Albumin: 4.7 g/dL (ref 3.5–5.5)
Alkaline Phosphatase: 99 IU/L (ref 39–117)
BILIRUBIN TOTAL: 0.8 mg/dL (ref 0.0–1.2)
BUN/Creatinine Ratio: 16 (ref 9–20)
BUN: 16 mg/dL (ref 6–24)
CALCIUM: 9.7 mg/dL (ref 8.7–10.2)
CO2: 25 mmol/L (ref 18–29)
Chloride: 94 mmol/L — ABNORMAL LOW (ref 96–106)
Creatinine, Ser: 0.98 mg/dL (ref 0.76–1.27)
GFR, EST AFRICAN AMERICAN: 104 mL/min/{1.73_m2} (ref >59)
GFR, EST NON AFRICAN AMERICAN: 90 mL/min/{1.73_m2} (ref >59)
GLUCOSE: 101 mg/dL — AB (ref 65–99)
Globulin, Total: 2.6 g/dL (ref 1.5–4.5)
Potassium: 4.1 mmol/L (ref 3.5–5.2)
Sodium: 136 mmol/L (ref 134–144)
TOTAL PROTEIN: 7.3 g/dL (ref 6.0–8.5)

## 2016-05-24 LAB — LIPID PANEL
CHOLESTEROL TOTAL: 229 mg/dL — AB (ref 100–199)
Chol/HDL Ratio: 3.4 ratio units (ref 0.0–5.0)
HDL: 67 mg/dL (ref >39)
LDL Calculated: 146 mg/dL — ABNORMAL HIGH (ref 0–99)
TRIGLYCERIDES: 79 mg/dL (ref 0–149)
VLDL Cholesterol Cal: 16 mg/dL (ref 5–40)

## 2016-05-26 ENCOUNTER — Encounter: Payer: Self-pay | Admitting: Physician Assistant

## 2016-05-28 MED ORDER — LISINOPRIL 5 MG PO TABS: 5.0000 mg | ORAL_TABLET | Freq: Every day | ORAL | 1 refills | Status: DC

## 2016-05-28 NOTE — Progress Notes (Signed)
Mike Olson  MRN: [id number] DOB: 1965-08-04  PCP: Virgilio Belling  Subjective:  Pt presents to clinic for a CPE.  Back pain on left side as well as right shoudler, insidious onset about a month ago. Ibuprofen last night, 400mg  after he woke up from sleep with pain. Pain rated a 5/10 when its at its worst. More sharp than dull, twisting certain ways is very sharp, otherwise its just aggravating.  Worse when he sleeps.  No injury that he knows of.  Home BP monitory - does at pharmacy 130s/80s  Last dental exam: >1.5 year ago Last vision exam: within last 3 moths Last colonoscopy: never - plans to have next year maybe Vaccinations; UTD   Exercises: walks around [place] when it is warm outside Sleeps: good  Patient Active Problem List   Diagnosis Date Noted  . Benign essential HTN 03/17/2015  . Heartburn 02/17/2015    Review of Systems  Constitutional: Negative.   HENT: Negative.   Eyes: Negative.   Respiratory: Negative.   Cardiovascular: Negative.   Gastrointestinal: Negative.   Endocrine: Negative.   Genitourinary: Negative.   Musculoskeletal: Positive for back pain. Negative for gait problem.  Skin: Negative.   Allergic/Immunologic: Negative.   Neurological: Negative.   Hematological: Negative.   Psychiatric/Behavioral: Negative.      Current Outpatient Prescriptions on File Prior to Visit  Medication Sig Dispense Refill  . esomeprazole (NEXIUM) 40 MG capsule TAKE 1 CAPSULE (40 MG TOTAL) BY MOUTH DAILY. 90 capsule 0  . ibuprofen (ADVIL,MOTRIN) 200 MG tablet Take 600 mg by mouth every 6 (six) hours as needed (pain).    Marland Kitchen lisinopril (PRINIVIL,ZESTRIL) 5 MG tablet Take 1 tablet (5 mg total) by mouth daily. 15 tablet 0   No current facility-administered medications on file prior to visit.     Allergies  Allergen Reactions  . Penicillins Other (See Comments)    unknown    Social History   Social History  . Marital status: Married    Spouse name:  N/A  . Number of children: 2  . Years of education: 12   Occupational History  . 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 Smoking status: Never Smoker  . Smokeless tobacco: Current User    Types: Chew  . Alcohol use Yes     Comment: " a couple drinks per night"  . Drug use: No  . Sexual activity: Yes    Partners: Female   Other Topics Concern  . None   Social History Narrative   Work - [employer]   [employer]-    Children - 2   Exercises daily at lunch hours, usually walks a few miles   [employer] - Eats fired food occasionally but mostly grilled foods and salads.     Past Surgical History:  Procedure Laterality Date  . APPENDECTOMY    . HERNIA REPAIR    . KNEE SURGERY Left   . LAPAROSCOPIC APPENDECTOMY N/A 04/24/2013   Procedure: APPENDECTOMY LAPAROSCOPIC;  Surgeon: Velora Heckler, MD;  Location: [facility];  Service: General;  Laterality: N/A;  . SHOULDER SURGERY      Family History  Problem Relation Age of Onset  . Hypertension Father   . Hyperlipidemia Father   . Alzheimer's disease Father   . Parkinson's disease Father   . Arthritis Mother     RA     Objective:  BP 130/80 (BP Location: Right Arm, Patient Position: Sitting, Cuff  Size: Small)   Pulse 85   Temp 97.7 F (36.5 C) (Oral)   Resp 18   Ht 5' 8.11" (1.73 m)   Wt 161 lb 6.4 oz (73.2 kg)   SpO2 96%   BMI 24.46 kg/m   Physical Exam  Constitutional: He is oriented to person, place, and time and well-developed, well-nourished, and in no distress.  HENT:  Head: Normocephalic and atraumatic.  Right Ear: Hearing, tympanic membrane, external ear and ear canal normal.  Left Ear: Hearing, tympanic membrane, external ear and ear canal normal.  Nose: Nose normal.  Mouth/Throat: Uvula is midline, oropharynx is clear and moist and mucous membranes are normal.  Eyes: Conjunctivae and EOM are normal. Pupils are equal, round, and reactive to light.  Neck: Trachea normal and normal range of motion.  Neck supple. No thyroid mass and no thyromegaly present.  Cardiovascular: Normal rate, regular rhythm and normal heart sounds.   No murmur heard. Pulmonary/Chest: Effort normal and breath sounds normal.  Abdominal: Soft. Bowel sounds are normal.  Genitourinary: Testes/scrotum normal and penis normal.  Musculoskeletal: Normal range of motion.       Lumbar back: He exhibits tenderness (left lower back), pain and spasm (left paraspinal muscle).  Neurological: He is alert and oriented to person, place, and time. He has normal sensation and normal strength. He displays abnormal reflex. He displays no weakness. He has a normal Straight Leg Raise Test. Gait normal. Gait normal.  Skin: Skin is warm and dry.  Psychiatric: Mood, memory, affect and judgment normal.    Visual Acuity Screening   Right eye Left eye Both eyes  Without correction: 20/25-1 20/20-1 20/20-1  With correction:       Assessment and Plan :  Encounter for Medicare annual wellness exam - Plan: Comprehensive metabolic panel  Benign essential HTN - Plan: lisinopril (PRINIVIL,ZESTRIL) 5 MG tablet - continue current medications - continue monitoring BP outside of office  Screening for hyperlipidemia - Plan: Lipid panel  Screening, anemia, deficiency, iron - Plan: CBC with Differential/Platelet  Screening for diabetes mellitus (DM) - Plan:  Left-sided thoracic back pain, unspecified chronicity - Plan: cyclobenzaprine (FLEXERIL) 5 MG tablet - heat and stretches - pt will just use at night as this is when the pain is bothering him the most.  Benny LennertSarah Weber PA-C  Primary [facility] at [facility] 05/28/2016 12:13 PM

## 2016-05-29 ENCOUNTER — Ambulatory Visit: Payer: BLUE CROSS/BLUE SHIELD | Admitting: Physician Assistant

## 2016-06-21 DIAGNOSIS — L814 Other melanin hyperpigmentation: Secondary | ICD-10-CM | POA: Diagnosis not present

## 2016-06-21 DIAGNOSIS — L821 Other seborrheic keratosis: Secondary | ICD-10-CM | POA: Diagnosis not present

## 2016-06-21 DIAGNOSIS — D225 Melanocytic nevi of trunk: Secondary | ICD-10-CM | POA: Diagnosis not present

## 2016-06-21 DIAGNOSIS — D229 Melanocytic nevi, unspecified: Secondary | ICD-10-CM | POA: Diagnosis not present

## 2016-06-21 DIAGNOSIS — D2362 Other benign neoplasm of skin of left upper limb, including shoulder: Secondary | ICD-10-CM | POA: Diagnosis not present

## 2016-06-21 DIAGNOSIS — D1801 Hemangioma of skin and subcutaneous tissue: Secondary | ICD-10-CM | POA: Diagnosis not present

## 2016-07-22 ENCOUNTER — Other Ambulatory Visit: Payer: Self-pay | Admitting: Physician Assistant

## 2016-07-22 DIAGNOSIS — R12 Heartburn: Secondary | ICD-10-CM

## 2016-07-23 NOTE — Telephone Encounter (Signed)
Done

## 2016-12-28 ENCOUNTER — Other Ambulatory Visit: Payer: Self-pay | Admitting: Physician Assistant

## 2016-12-28 DIAGNOSIS — I10 Essential (primary) hypertension: Secondary | ICD-10-CM

## 2016-12-31 ENCOUNTER — Other Ambulatory Visit: Payer: Self-pay

## 2016-12-31 DIAGNOSIS — I10 Essential (primary) hypertension: Secondary | ICD-10-CM

## 2016-12-31 MED ORDER — LISINOPRIL 5 MG PO TABS: 5.0000 mg | ORAL_TABLET | Freq: Every day | ORAL | 0 refills | Status: DC

## 2017-01-22 ENCOUNTER — Other Ambulatory Visit: Payer: Self-pay

## 2017-01-22 ENCOUNTER — Encounter: Payer: Self-pay | Admitting: Emergency Medicine

## 2017-01-22 ENCOUNTER — Ambulatory Visit: Payer: BLUE CROSS/BLUE SHIELD | Admitting: Emergency Medicine

## 2017-01-22 VITALS — BP 106/74 | HR 64 | Temp 98.7°F | Resp 16 | Ht 68.25 in | Wt 168.4 lb

## 2017-01-22 DIAGNOSIS — R197 Diarrhea, unspecified: Secondary | ICD-10-CM | POA: Diagnosis not present

## 2017-01-22 DIAGNOSIS — Z23 Encounter for immunization: Secondary | ICD-10-CM

## 2017-01-22 DIAGNOSIS — R109 Unspecified abdominal pain: Secondary | ICD-10-CM

## 2017-01-22 DIAGNOSIS — B349 Viral infection, unspecified: Secondary | ICD-10-CM | POA: Diagnosis not present

## 2017-01-22 DIAGNOSIS — I10 Essential (primary) hypertension: Secondary | ICD-10-CM

## 2017-01-22 MED ORDER — LISINOPRIL 10 MG PO TABS: 10.0000 mg | ORAL_TABLET | Freq: Every day | ORAL | 3 refills | Status: DC

## 2017-01-22 MED ORDER — DIPHENOXYLATE-ATROPINE 2.5-0.025 MG PO TABS: 1.0000 | ORAL_TABLET | Freq: Four times a day (QID) | ORAL | 0 refills | Status: DC | PRN

## 2017-01-22 NOTE — Patient Instructions (Addendum)
   IF you received an x-ray today, you will receive an invoice from [facility]. Please contact [facility] at [phone] with questions or concerns regarding your invoice.   IF you received labwork today, you will receive an invoice from [facility]. Please contact [facility] at [phone] with questions or concerns regarding your invoice.   Our billing staff will not be able to assist you with questions regarding bills from these companies.  You will be contacted with the lab results as soon as they are available. The fastest way to get your results is to activate your My Chart account. Instructions are located on the last page of this paperwork. If you have not heard from us regarding the results in 2 weeks, please contact this office.     Diarrhea, Adult Diarrhea is when you have loose and water poop (stool) often. Diarrhea can make you feel weak and cause you to get dehydrated. Dehydration can make you tired and thirsty, make you have a dry mouth, and make it so you pee (urinate) less often. Diarrhea often lasts 2-3 days. However, it can last longer if it is a sign of something more serious. It is important to treat your diarrhea as told by your doctor. Follow these instructions at home: Eating and drinking  Follow these recommendations as told by your doctor:  Take an oral rehydration solution (ORS). This is a drink that is sold at pharmacies and stores.  Drink clear fluids, such as: ? Water. ? Ice chips. ? Diluted fruit juice. ? Low-calorie sports drinks.  Eat bland, easy-to-digest foods in small amounts as you are able. These foods include: ? Bananas. ? Applesauce. ? Rice. ? Low-fat (lean) meats. ? Toast. ? Crackers.  Avoid drinking fluids that have a lot of sugar or caffeine in them.  Avoid alcohol.  Avoid spicy or fatty foods.  General instructions   Drink enough fluid to keep your pee (urine) clear or pale yellow.  Wash your hands often. If  you cannot use soap and water, use hand sanitizer.  Make sure that all people in your home wash their hands well and often.  Take over-the-counter and prescription medicines only as told by your doctor.  Rest at home while you get better.  Watch your condition for any changes.  Take a warm bath to help with any burning or pain from having diarrhea.  Keep all follow-up visits as told by your doctor. This is important. Contact a doctor if:  You have a fever.  Your diarrhea gets worse.  You have new symptoms.  You cannot keep fluids down.  You feel light-headed or dizzy.  You have a headache.  You have muscle cramps. Get help right away if:  You have chest pain.  You feel very weak or you pass out (faint).  You have bloody or black poop or poop that look like tar.  You have very bad pain, cramping, or bloating in your belly (abdomen).  You have trouble breathing or you are breathing very quickly.  Your heart is beating very quickly.  Your skin feels cold and clammy.  You feel confused.  You have signs of dehydration, such as: ? Dark pee, hardly any pee, or no pee. ? Cracked lips. ? Dry mouth. ? Sunken eyes. ? Sleepiness. ? Weakness. This information is not intended to replace advice given to you by your health care provider. Make sure you discuss any questions you have with your health care provider. Document Released: 08/08/2007   Document Revised: 09/09/2015 Document Reviewed: 10/26/2014 Elsevier Interactive Patient Education  2018 [employer]  

## 2017-01-22 NOTE — Progress Notes (Signed)
Mike Olson 51 y.o.   Chief Complaint  Patient presents with  . Abdominal Pain    per patient more so cramping x 3-4 days  . Diarrhea    HISTORY OF PRESENT ILLNESS: This is a 51 y.o. male complaining of watery non-bloody diarrhea with generalized abdominal cramping for 3-4 days; also has h/o GERD taking Nexium; states he's also had flu-like symptoms x 1 week and swallowing the "drainage" is making symptoms worse. Also BP numbers have been high at home the last several weeks: 140-150's/80-90's   !HPI   Prior to Admission medications   Medication Sig Start Date End Date Taking? Authorizing Provider  esomeprazole (NEXIUM) 40 MG capsule TAKE 1 CAPSULE (40 MG TOTAL) BY MOUTH DAILY. 07/23/16  Yes Weber, [name] L, PA-C  lisinopril (PRINIVIL,ZESTRIL) 5 MG tablet Take 1 tablet (5 mg total) by mouth daily. 12/31/16 06/29/17 Yes Weber, [name] L, PA-C  cyclobenzaprine (FLEXERIL) 5 MG tablet Take 1-2 tablets (5-10 mg total) by mouth at bedtime as needed for muscle spasms. Patient not taking: Reported on 01/22/2017 05/23/16   Morrell Riddle, PA-C  ibuprofen (ADVIL,MOTRIN) 200 MG tablet Take 600 mg by mouth every 6 (six) hours as needed (pain).    [provider]    Allergies  Allergen Reactions  . Penicillins Other (See Comments)    unknown    Patient Active Problem List   Diagnosis Date Noted  . Benign essential HTN 03/17/2015  . Heartburn 02/17/2015    Past Medical History:  Diagnosis Date  . Allergy   . PONV (postoperative nausea and vomiting)     Past Surgical History:  Procedure Laterality Date  . APPENDECTOMY    . HERNIA REPAIR    . KNEE SURGERY Left   . LAPAROSCOPIC APPENDECTOMY N/A 04/24/2013   Procedure: APPENDECTOMY LAPAROSCOPIC;  Surgeon: Velora Heckler, MD;  Location: [facility];  Service: General;  Laterality: N/A;  . SHOULDER SURGERY      Social History   Socioeconomic History  . Marital status: Married    Spouse name: Not on file  . Number of  children: 2  . Years of education: 35  . Highest education level: Not on file  Social Needs  . Financial resource strain: Not on file  . Food insecurity - worry: Not on file  . Food insecurity - inability: Not on file  . Transportation needs - medical: Not on file  . Transportation needs - non-medical: Not on file  Occupational History  . 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 Smoking status: Never Smoker  . Smokeless tobacco: Current User    Types: Chew  Substance and Sexual Activity  . Alcohol use: Yes    Comment: " a couple drinks per night"  . Drug use: No  . Sexual activity: Yes    Partners: Female  Other Topics Concern  . Not on file  Social History Narrative   Work - [employer]   [employer]-    Children - 2   Exercises daily at lunch hours, usually walks a few miles   [employer] - Eats fired food occasionally but mostly grilled foods and salads.     Family History  Problem Relation Age of Onset  . Hypertension Father   . Hyperlipidemia Father   . Alzheimer's disease Father   . Parkinson's disease Father   . Arthritis Mother        RA     Review  of Systems  Constitutional: Negative for chills and fever.  HENT: Positive for congestion. Negative for nosebleeds and sore throat.   Eyes: Negative for discharge and redness.  Respiratory: Negative.  Negative for cough and shortness of breath.   Cardiovascular: Negative.  Negative for chest pain.  Gastrointestinal: Positive for abdominal pain, diarrhea and nausea. Negative for blood in stool, melena and vomiting.  Genitourinary: Negative.  Negative for dysuria and hematuria.  Musculoskeletal: Negative for back pain, myalgias and neck pain.  Skin: Negative for rash.  Neurological: Positive for weakness. Negative for dizziness and headaches.  Endo/Heme/Allergies: Negative.   All other systems reviewed and are negative.  Vitals:   01/22/17 1451  BP: 106/74  Pulse: 64  Resp: 16  Temp: 98.7  F (37.1 C)  SpO2: 97%     Physical Exam  Constitutional: He is oriented to person, place, and time. He appears well-developed and well-nourished.  HENT:  Head: Normocephalic and atraumatic.  Right Ear: External ear normal.  Left Ear: External ear normal.  Nose: Nose normal.  Mouth/Throat: Oropharynx is clear and moist.  Eyes: Conjunctivae and EOM are normal. Pupils are equal, round, and reactive to light.  Neck: Normal range of motion. Neck supple. No JVD present. No thyromegaly present.  Cardiovascular: Normal rate, regular rhythm, normal heart sounds and intact distal pulses.  Pulmonary/Chest: Effort normal and breath sounds normal.  Abdominal: Soft. Bowel sounds are normal. He exhibits no distension. There is no tenderness.  Musculoskeletal: Normal range of motion.  Lymphadenopathy:    He has no cervical adenopathy.  Neurological: He is alert and oriented to person, place, and time. No sensory deficit. He exhibits normal muscle tone.  Skin: Skin is warm and dry. Capillary refill takes less than 2 seconds. No rash noted.  Psychiatric: He has a normal mood and affect. His behavior is normal.  Vitals reviewed.    ASSESSMENT & PLAN: Mike Olson was seen today for abdominal pain and diarrhea.  Diagnoses and all orders for this visit:  Diarrhea, unspecified type -     diphenoxylate-atropine (LOMOTIL) 2.5-0.025 MG tablet; Take 1 tablet by mouth 4 (four) times daily as needed for diarrhea or loose stools.  Need for prophylactic vaccination and inoculation against influenza -     Flu Vaccine QUAD 36+ mos IM  Viral illness  Benign essential HTN -     lisinopril (PRINIVIL,ZESTRIL) 10 MG tablet; Take 1 tablet (10 mg total) by mouth daily.  Abdominal cramping -     diphenoxylate-atropine (LOMOTIL) 2.5-0.025 MG tablet; Take 1 tablet by mouth 4 (four) times daily as needed for diarrhea or loose stools.    Patient Instructions       IF you received an x-ray today, you will  receive an invoice from [facility]. Please contact [facility] at [phone] with questions or concerns regarding your invoice.   IF you received labwork today, you will receive an invoice from [facility]. Please contact [facility] at [phone] with questions or concerns regarding your invoice.   Our billing staff will not be able to assist you with questions regarding bills from these companies.  You will be contacted with the lab results as soon as they are available. The fastest way to get your results is to activate your My Chart account. Instructions are located on the last page of this paperwork. If you have not heard from us regarding the results in 2 weeks, please contact this office.     Diarrhea, Adult Diarrhea is when you have  loose and water poop (stool) often. Diarrhea can make you feel weak and cause you to get dehydrated. Dehydration can make you tired and thirsty, make you have a dry mouth, and make it so you pee (urinate) less often. Diarrhea often lasts 2-3 days. However, it can last longer if it is a sign of something more serious. It is important to treat your diarrhea as told by your doctor. Follow these instructions at home: Eating and drinking  Follow these recommendations as told by your doctor:  Take an oral rehydration solution (ORS). This is a drink that is sold at pharmacies and stores.  Drink clear fluids, such as: ? Water. ? Ice chips. ? Diluted fruit juice. ? Low-calorie sports drinks.  Eat bland, easy-to-digest foods in small amounts as you are able. These foods include: ? Bananas. ? Applesauce. ? Rice. ? Low-fat (lean) meats. ? Toast. ? Crackers.  Avoid drinking fluids that have a lot of sugar or caffeine in them.  Avoid alcohol.  Avoid spicy or fatty foods.  General instructions   Drink enough fluid to keep your pee (urine) clear or pale yellow.  Wash your hands often. If you cannot use soap and water, use hand  sanitizer.  Make sure that all people in your home wash their hands well and often.  Take over-the-counter and prescription medicines only as told by your doctor.  Rest at home while you get better.  Watch your condition for any changes.  Take a warm bath to help with any burning or pain from having diarrhea.  Keep all follow-up visits as told by your doctor. This is important. Contact a doctor if:  You have a fever.  Your diarrhea gets worse.  You have new symptoms.  You cannot keep fluids down.  You feel light-headed or dizzy.  You have a headache.  You have muscle cramps. Get help right away if:  You have chest pain.  You feel very weak or you pass out (faint).  You have bloody or black poop or poop that look like tar.  You have very bad pain, cramping, or bloating in your belly (abdomen).  You have trouble breathing or you are breathing very quickly.  Your heart is beating very quickly.  Your skin feels cold and clammy.  You feel confused.  You have signs of dehydration, such as: ? Dark pee, hardly any pee, or no pee. ? Cracked lips. ? Dry mouth. ? Sunken eyes. ? Sleepiness. ? Weakness. This information is not intended to replace advice given to you by your health care provider. Make sure you discuss any questions you have with your health care provider. Document Released: 08/08/2007 Document Revised: 09/09/2015 Document Reviewed: 10/26/2014 Elsevier Interactive Patient Education  2018 Elsevier Inc.      Edwina BarthMiguel Eliav Mechling, MD [facility]

## 2017-01-31 ENCOUNTER — Other Ambulatory Visit: Payer: Self-pay

## 2017-01-31 ENCOUNTER — Encounter: Payer: Self-pay | Admitting: Physician Assistant

## 2017-01-31 ENCOUNTER — Ambulatory Visit: Payer: BLUE CROSS/BLUE SHIELD | Admitting: Physician Assistant

## 2017-01-31 VITALS — BP 122/80 | HR 82 | Temp 99.1°F | Resp 18 | Ht 68.25 in | Wt 169.6 lb

## 2017-01-31 DIAGNOSIS — R1084 Generalized abdominal pain: Secondary | ICD-10-CM | POA: Diagnosis not present

## 2017-01-31 DIAGNOSIS — B349 Viral infection, unspecified: Secondary | ICD-10-CM

## 2017-01-31 DIAGNOSIS — R109 Unspecified abdominal pain: Secondary | ICD-10-CM | POA: Diagnosis not present

## 2017-01-31 DIAGNOSIS — R197 Diarrhea, unspecified: Secondary | ICD-10-CM

## 2017-01-31 MED ORDER — DIPHENOXYLATE-ATROPINE 2.5-0.025 MG PO TABS: 1.0000 | ORAL_TABLET | Freq: Four times a day (QID) | ORAL | 0 refills | Status: DC | PRN

## 2017-01-31 NOTE — Progress Notes (Signed)
Patient ID: Mike Olson, male    DOB: 19-Nov-1965, 51 y.o.   MRN: [id number]  PCP: Morrell RiddleWeber, [name] L, PA-C  Chief Complaint  Patient presents with  . Diarrhea    Pt states he is still experiencing diarrhea. Pt states he is having to get up in the middle of the night and is experiencing gas. Pt states Lomotil helped some.  . Follow-up    Subjective:   Presents for evaluation of diarrhea x 2 weeks.  He was seen 01/22/2017 by a colleague with diarrhea x 2 weeks. Prescribed Lomotil. Lomotil helps some, but continues to have more than 3 watery stools/day, though he does have a soft, formed stool interspersed. Sometimes has to get up during the night to defecate. No blood. Low abdominal pain, 6-7/10, cramping. Increased bowel gas for more than a year. Has not tried any treatments. He plans to schedule a colonoscopy in the next year, having just turned 51.  No recent travel, new medications/foods, known sick contacts.     Review of Systems  Constitutional: Positive for fever (subjective). Negative for activity change, appetite change and chills.  HENT: Positive for postnasal drip and rhinorrhea. Negative for congestion, sinus pressure, sinus pain, sore throat and trouble swallowing. Ear pain: LEFT feels like water in it.   Eyes: Negative for visual disturbance.  Respiratory: Negative for shortness of breath.   Cardiovascular: Negative for chest pain and palpitations.  Gastrointestinal: Positive for abdominal distention, abdominal pain and diarrhea. Negative for anal bleeding, blood in stool, constipation, nausea, rectal pain and vomiting.  Genitourinary: Negative for dysuria, frequency, hematuria and urgency.  Musculoskeletal: Negative for myalgias.  Skin: Negative for rash.  Neurological: Positive for weakness.  Psychiatric/Behavioral: Positive for sleep disturbance.       Patient Active Problem List   Diagnosis Date Noted  . Benign essential HTN 03/17/2015  .  Heartburn 02/17/2015     Prior to Admission medications   Medication Sig Start Date End Date Taking? Authorizing Provider  diphenoxylate-atropine (LOMOTIL) 2.5-0.025 MG tablet Take 1 tablet by mouth 4 (four) times daily as needed for diarrhea or loose stools. 01/22/17  Yes Sagardia, Eilleen KempfMiguel Jose, MD  esomeprazole (NEXIUM) 40 MG capsule TAKE 1 CAPSULE (40 MG TOTAL) BY MOUTH DAILY. 07/23/16  Yes Weber, [name] L, PA-C  ibuprofen (ADVIL,MOTRIN) 200 MG tablet Take 600 mg by mouth every 6 (six) hours as needed (pain).   Yes [provider]  lisinopril (PRINIVIL,ZESTRIL) 10 MG tablet Take 1 tablet (10 mg total) by mouth daily. 01/22/17  Yes Sagardia, Eilleen KempfMiguel Jose, MD  cyclobenzaprine (FLEXERIL) 5 MG tablet Take 1-2 tablets (5-10 mg total) by mouth at bedtime as needed for muscle spasms. Patient not taking: Reported on 01/22/2017 05/23/16   Morrell RiddleWeber, [name] L, PA-C     Allergies  Allergen Reactions  . Penicillins Other (See Comments)    unknown       Objective:  Physical Exam  Constitutional: He is oriented to person, place, and time. He appears well-developed and well-nourished. He is active and cooperative. No distress.  BP 122/80 (BP Location: Left Arm, Patient Position: Sitting, Cuff Size: Large)   Pulse 82   Temp 99.1 F (37.3 C) (Oral)   Resp 18   Ht 5' 8.25" (1.734 m)   Wt 169 lb 9.6 oz (76.9 kg)   SpO2 97%   BMI 25.60 kg/m   HENT:  Head: Normocephalic and atraumatic.  Right Ear: Hearing normal.  Left Ear: Hearing normal.  Eyes:  Conjunctivae are normal. No scleral icterus.  Neck: Normal range of motion. Neck supple. No thyromegaly present.  Cardiovascular: Normal rate, regular rhythm and normal heart sounds.  Pulses:      Radial pulses are 2+ on the right side, and 2+ on the left side.  Pulmonary/Chest: Effort normal and breath sounds normal.  Abdominal: Soft. Normal appearance and bowel sounds are normal. He exhibits no distension and no mass. There is no  hepatosplenomegaly. There is generalized tenderness (worst in the lower quadrants). There is no rigidity, no rebound, no guarding, no CVA tenderness, no tenderness at McBurney's point and negative Murphy's sign.  Lymphadenopathy:       Head (right side): No tonsillar, no preauricular, no posterior auricular and no occipital adenopathy present.       Head (left side): No tonsillar, no preauricular, no posterior auricular and no occipital adenopathy present.    He has no cervical adenopathy.       Right: No supraclavicular adenopathy present.       Left: No supraclavicular adenopathy present.  Neurological: He is alert and oriented to person, place, and time. No sensory deficit.  Skin: Skin is warm, dry and intact. No rash noted. No cyanosis or erythema. Nails show no clubbing.  Psychiatric: He has a normal mood and affect. His speech is normal and behavior is normal.           Assessment & Plan:   1. Diarrhea, unspecified type 2. Generalized abdominal pain 3. Abdominal cramping ?Viral enteritis? Await labs. Refer to GI for evaluation and colon cancer screening. Advance diet to include more fiber. Consider supplement if needed. OK to continue Lomotil PRN. Add OTC simethicone product to reduce gas/bloating. Stay hydrated. - CBC with Differential/Platelet - Comprehensive metabolic panel - TSH - Urinalysis, dipstick only - Stool culture - Ova and parasite examination - Clostridium Difficile by PCR - Ambulatory referral to Gastroenterology - diphenoxylate-atropine (LOMOTIL) 2.5-0.025 MG tablet; Take 1 tablet by mouth 4 (four) times daily as needed for diarrhea or loose stools.  Dispense: 20 tablet; Refill: 0  4. Viral illness Anticipatory guidance provided. Supportive care.    Return if symptoms worsen or fail to improve.   Fernande Brashelle S. Elnathan Fulford, PA-C [facility]

## 2017-01-31 NOTE — Progress Notes (Signed)
Subjective:    Patient ID: Mike Olson, male    DOB: 28-Mar-1965, 51 y.o.   MRN: [id number]  HPI   Mike Olson is a 51 year old caucasian male that presents for evaluation of diarrhea that has been going on for about two weeks. He was last seen by Dr. Alvy BimlerSagardia on 01/22/17 for diarrhea and was given a prescription of Lomotil. He states that he continues to have daily episodes of diarrhea that occur more than three times a day. He denies blood in his stools. He has been taking the Lomotil daily and that has helped. He states some of his stools are formed while others are not. He reports that he has also been having abdominal pain in bilateral lower quadrants and rates the pain at a 6-7 out of ten. He describes the pain as being cramping. He is having to wake up some nights to have a BM.   He endorses gassiness that has been happening for over an year now. He has not tried taking anything to help. Patient states that he will be getting a colonoscopy this year since he just turned 51.  Patient lives at home with his wife. He consumes 2 alcoholic drinks per night. He does not use tobacco products or illicit drugs. He mostly eats salads and grilled foods, but occasionally eats fired foods.   Patient Active Problem List   Diagnosis Date Noted  . Benign essential HTN 03/17/2015  . Heartburn 02/17/2015   Allergies  Allergen Reactions  . Penicillins Other (See Comments)    unknown    Current Outpatient Medications:  .  diphenoxylate-atropine (LOMOTIL) 2.5-0.025 MG tablet, Take 1 tablet by mouth 4 (four) times daily as needed for diarrhea or loose stools., Disp: 20 tablet, Rfl: 0 .  esomeprazole (NEXIUM) 40 MG capsule, TAKE 1 CAPSULE (40 MG TOTAL) BY MOUTH DAILY., Disp: 90 capsule, Rfl: 4 .  ibuprofen (ADVIL,MOTRIN) 200 MG tablet, Take 600 mg by mouth every 6 (six) hours as needed (pain)., Disp: , Rfl:  .  lisinopril (PRINIVIL,ZESTRIL) 10 MG tablet, Take 1 tablet (10 mg total) by mouth daily.,  Disp: 90 tablet, Rfl: 3 .  cyclobenzaprine (FLEXERIL) 5 MG tablet, Take 1-2 tablets (5-10 mg total) by mouth at bedtime as needed for muscle spasms. (Patient not taking: Reported on 01/22/2017), Disp: 20 tablet, Rfl: 1  Review of Systems  Constitutional: Positive for fatigue. Negative for appetite change, chills, fever and unexpected weight change.  HENT: Positive for congestion and rhinorrhea. Negative for sore throat.   Respiratory: Negative for cough and shortness of breath.   Cardiovascular: Positive for chest pain (hearburnt related). Negative for palpitations.  Gastrointestinal: Positive for abdominal distention, abdominal pain and diarrhea. Negative for blood in stool, constipation, nausea and vomiting.  Genitourinary: Negative for difficulty urinating.  Neurological: Positive for weakness. Negative for headaches.       Objective:   Physical Exam  Constitutional: He is oriented to person, place, and time. He appears well-developed and well-nourished. No distress.  HENT:  Head: Normocephalic and atraumatic.  Right Ear: External ear normal.  Left Ear: External ear normal.  Nose: Nose normal.  Mouth/Throat: Oropharynx is clear and moist.  Neck: No tracheal deviation present. No thyromegaly present.  Cardiovascular: Normal rate, regular rhythm, normal heart sounds and intact distal pulses.  No murmur heard. Pulmonary/Chest: Effort normal and breath sounds normal.  Abdominal: Soft. Bowel sounds are normal. He exhibits no distension. There is tenderness (to palpation of all 4 quadrants,  but more in bilateral lower quadrants).  Lymphadenopathy:    He has no cervical adenopathy.  Neurological: He is alert and oriented to person, place, and time.  Skin: Skin is warm. No rash noted.  Psychiatric: He has a normal mood and affect.      Assessment & Plan:  1. Diarrhea, unspecified type 2. Generalized abdominal pain  -Continue Lomotil, one tablet by mouth up to four times daily as  needed for diarrhea or loose stools -Ordered CBC w/ diff, CMP, TSH, stool culture, ova and parasite examination, urinalysis- dipstick only, C. Diff by PCR. - Patient education information given. -Referral to gastroenterology -Patient should follow up as needed if symptoms persist or worsen.  Mike Olson, [place]

## 2017-01-31 NOTE — Patient Instructions (Addendum)
Make sure that you are staying well hydrated. Make sure that you are getting plenty of fiber in your diet. ADD simethicone with your meals and before bed to help reduce the gas.   IF you received an x-ray today, you will receive an invoice from [facility]. Please contact [facility] at [phone] with questions or concerns regarding your invoice.   IF you received labwork today, you will receive an invoice from [facility]. Please contact [facility] at [phone] with questions or concerns regarding your invoice.   Our billing staff will not be able to assist you with questions regarding bills from these companies.  You will be contacted with the lab results as soon as they are available. The fastest way to get your results is to activate your My Chart account. Instructions are located on the last page of this paperwork. If you have not heard from us regarding the results in 2 weeks, please contact this office.

## 2017-02-01 DIAGNOSIS — R197 Diarrhea, unspecified: Secondary | ICD-10-CM | POA: Diagnosis not present

## 2017-02-01 LAB — CBC WITH DIFFERENTIAL/PLATELET
BASOS ABS: 0 10*3/uL (ref 0.0–0.2)
BASOS: 0 %
EOS (ABSOLUTE): 0.1 10*3/uL (ref 0.0–0.4)
Eos: 1 %
Hematocrit: 52 % — ABNORMAL HIGH (ref 37.5–51.0)
Hemoglobin: 17.8 g/dL — ABNORMAL HIGH (ref 13.0–17.7)
IMMATURE GRANS (ABS): 0 10*3/uL (ref 0.0–0.1)
IMMATURE GRANULOCYTES: 0 %
LYMPHS: 23 %
Lymphocytes Absolute: 1.6 10*3/uL (ref 0.7–3.1)
MCH: 32.1 pg (ref 26.6–33.0)
MCHC: 34.2 g/dL (ref 31.5–35.7)
MCV: 94 fL (ref 79–97)
MONOCYTES: 10 %
Monocytes Absolute: 0.7 10*3/uL (ref 0.1–0.9)
NEUTROS PCT: 66 %
Neutrophils Absolute: 4.4 10*3/uL (ref 1.4–7.0)
PLATELETS: 237 10*3/uL (ref 150–379)
RBC: 5.55 x10E6/uL (ref 4.14–5.80)
RDW: 13.5 % (ref 12.3–15.4)
WBC: 6.7 10*3/uL (ref 3.4–10.8)

## 2017-02-01 LAB — URINALYSIS, DIPSTICK ONLY
BILIRUBIN UA: NEGATIVE
Glucose, UA: NEGATIVE
Ketones, UA: NEGATIVE
LEUKOCYTES UA: NEGATIVE
Nitrite, UA: NEGATIVE
PH UA: 6 (ref 5.0–7.5)
PROTEIN UA: NEGATIVE
RBC, UA: NEGATIVE
Specific Gravity, UA: 1.026 (ref 1.005–1.030)
Urobilinogen, Ur: 0.2 mg/dL (ref 0.2–1.0)

## 2017-02-01 LAB — COMPREHENSIVE METABOLIC PANEL
A/G RATIO: 1.8 (ref 1.2–2.2)
ALBUMIN: 4.9 g/dL (ref 3.5–5.5)
ALT: 32 IU/L (ref 0–44)
AST: 30 IU/L (ref 0–40)
Alkaline Phosphatase: 89 IU/L (ref 39–117)
BUN / CREAT RATIO: 11 (ref 9–20)
BUN: 11 mg/dL (ref 6–24)
Bilirubin Total: 0.8 mg/dL (ref 0.0–1.2)
CALCIUM: 9.8 mg/dL (ref 8.7–10.2)
CO2: 26 mmol/L (ref 20–29)
Chloride: 98 mmol/L (ref 96–106)
Creatinine, Ser: 1.01 mg/dL (ref 0.76–1.27)
GFR, EST AFRICAN AMERICAN: 100 mL/min/{1.73_m2} (ref >59)
GFR, EST NON AFRICAN AMERICAN: 86 mL/min/{1.73_m2} (ref >59)
GLOBULIN, TOTAL: 2.8 g/dL (ref 1.5–4.5)
Glucose: 95 mg/dL (ref 65–99)
POTASSIUM: 4.1 mmol/L (ref 3.5–5.2)
SODIUM: 140 mmol/L (ref 134–144)
TOTAL PROTEIN: 7.7 g/dL (ref 6.0–8.5)

## 2017-02-01 LAB — TSH: TSH: 1.18 u[IU]/mL (ref 0.450–4.500)

## 2017-02-02 ENCOUNTER — Encounter: Payer: Self-pay | Admitting: Physician Assistant

## 2017-02-02 LAB — CLOSTRIDIUM DIFFICILE BY PCR: CDIFFPCR: NEGATIVE

## 2017-02-03 ENCOUNTER — Encounter: Payer: Self-pay | Admitting: Physician Assistant

## 2017-02-05 LAB — STOOL CULTURE: E COLI SHIGA TOXIN ASSAY: NEGATIVE

## 2017-02-06 LAB — OVA AND PARASITE EXAMINATION

## 2017-04-04 ENCOUNTER — Encounter: Payer: Self-pay | Admitting: Physician Assistant

## 2017-06-11 DIAGNOSIS — L814 Other melanin hyperpigmentation: Secondary | ICD-10-CM | POA: Diagnosis not present

## 2017-06-11 DIAGNOSIS — D229 Melanocytic nevi, unspecified: Secondary | ICD-10-CM | POA: Diagnosis not present

## 2017-06-11 DIAGNOSIS — L821 Other seborrheic keratosis: Secondary | ICD-10-CM | POA: Diagnosis not present

## 2017-06-11 DIAGNOSIS — D1801 Hemangioma of skin and subcutaneous tissue: Secondary | ICD-10-CM | POA: Diagnosis not present

## 2017-07-05 DIAGNOSIS — L2089 Other atopic dermatitis: Secondary | ICD-10-CM | POA: Diagnosis not present

## 2017-07-18 ENCOUNTER — Other Ambulatory Visit: Payer: Self-pay

## 2017-07-18 ENCOUNTER — Ambulatory Visit: Payer: BLUE CROSS/BLUE SHIELD | Admitting: Physician Assistant

## 2017-07-18 ENCOUNTER — Encounter: Payer: Self-pay | Admitting: Physician Assistant

## 2017-07-18 VITALS — BP 130/78 | HR 87 | Temp 98.0°F | Resp 16 | Ht 68.25 in | Wt 165.4 lb

## 2017-07-18 DIAGNOSIS — Z1211 Encounter for screening for malignant neoplasm of colon: Secondary | ICD-10-CM | POA: Diagnosis not present

## 2017-07-18 DIAGNOSIS — R140 Abdominal distension (gaseous): Secondary | ICD-10-CM | POA: Diagnosis not present

## 2017-07-18 DIAGNOSIS — W57XXXA Bitten or stung by nonvenomous insect and other nonvenomous arthropods, initial encounter: Secondary | ICD-10-CM | POA: Diagnosis not present

## 2017-07-18 DIAGNOSIS — S0096XA Insect bite (nonvenomous) of unspecified part of head, initial encounter: Secondary | ICD-10-CM

## 2017-07-18 DIAGNOSIS — K219 Gastro-esophageal reflux disease without esophagitis: Secondary | ICD-10-CM | POA: Diagnosis not present

## 2017-07-18 DIAGNOSIS — J302 Other seasonal allergic rhinitis: Secondary | ICD-10-CM | POA: Diagnosis not present

## 2017-07-18 DIAGNOSIS — J45909 Unspecified asthma, uncomplicated: Secondary | ICD-10-CM | POA: Insufficient documentation

## 2017-07-18 DIAGNOSIS — H04129 Dry eye syndrome of unspecified lacrimal gland: Secondary | ICD-10-CM | POA: Insufficient documentation

## 2017-07-18 MED ORDER — ESOMEPRAZOLE MAGNESIUM 40 MG PO CPDR
40.0000 mg | DELAYED_RELEASE_CAPSULE | Freq: Every day | ORAL | 4 refills | Status: DC
Start: 2017-07-18 — End: 2018-10-23

## 2017-07-18 MED ORDER — FLUTICASONE PROPIONATE 50 MCG/ACT NA SUSP: 2.0000 | Freq: Every day | NASAL | 12 refills | Status: DC

## 2017-07-18 MED ORDER — RANITIDINE HCL 150 MG PO TABS
150.0000 mg | ORAL_TABLET | Freq: Two times a day (BID) | ORAL | 0 refills | Status: DC
Start: 2017-07-18 — End: 2019-04-13

## 2017-07-18 MED ORDER — DOXYCYCLINE HYCLATE 100 MG PO CAPS: 100.0000 mg | ORAL_CAPSULE | Freq: Two times a day (BID) | ORAL | 0 refills | Status: AC

## 2017-07-18 MED ORDER — AZELASTINE HCL 0.1 % NA SOLN: 2.0000 | Freq: Two times a day (BID) | NASAL | 0 refills | Status: DC

## 2017-07-18 NOTE — Progress Notes (Signed)
Patient ID: Mike Olson, male    DOB: 1965-10-23, 52 y.o.   MRN: [id number]  PCP: Morrell Riddle, PA-C  Chief Complaint  Patient presents with  . Animal Bite    notice tick yesterday, wife removed from left side of head behind ear, bloating, gassy, food does't process and goes alot   . Gastroesophageal Reflux    nexium not working would like another med, nasal drainage, itchy eyes,     Subjective:   Presents for evaluation of recent tick bite.  In the shower yesterday, he found a tick on the scalp behind the LEFT ear. His wife removed a fat, gray tick. Unclear how long it has been there. The site is tender, but not swollen, draining, or assocaited with swollen lymph nodes. No fever, headache, nausea.  He is experiencing increased heartburn symptoms, and cough. These feel like reflux, for which he takes esomeprazole. He'd like something stronger to replace it. Notes increased bloating, gassiness daily about 4 o'clock. Some foods (corn, green peppers, spinach, turnip greens) don't seem to digest well. Notes he's due for colonoscopy.  Seasonal allergies are worse. Previously had recurrent sinus problems, with headaches, always on the LEFT. Using cetirizine. Mowed last night, notes increased post nasal drainage. Itchy, watery eyes. Eye specialist advised he use eye drops (Systane). Thinks he may have previously used a steroid nasal spray.  Review of Systems As above.    Patient Active Problem List   Diagnosis Date Noted  . Benign essential HTN 03/17/2015  . Heartburn 02/17/2015     Prior to Admission medications   Medication Sig Start Date End Date Taking? Authorizing Provider  esomeprazole (NEXIUM) 40 MG capsule TAKE 1 CAPSULE (40 MG TOTAL) BY MOUTH DAILY. 07/23/16  Yes Weber, [name] L, PA-C  ibuprofen (ADVIL,MOTRIN) 200 MG tablet Take 600 mg by mouth every 6 (six) hours as needed (pain).   Yes [provider]  lisinopril (PRINIVIL,ZESTRIL) 10 MG  tablet Take 1 tablet (10 mg total) by mouth daily. 01/22/17  Yes Sagardia, Eilleen Kempf, MD  cyclobenzaprine (FLEXERIL) 5 MG tablet Take 1-2 tablets (5-10 mg total) by mouth at bedtime as needed for muscle spasms. Patient not taking: Reported on 07/18/2017 05/23/16   Morrell Riddle, PA-C  diphenoxylate-atropine (LOMOTIL) 2.5-0.025 MG tablet Take 1 tablet by mouth 4 (four) times daily as needed for diarrhea or loose stools. Patient not taking: Reported on 07/18/2017 01/31/17   Porfirio Oar, PA-C     Allergies  Allergen Reactions  . Penicillins Other (See Comments)    unknown       Objective:  Physical Exam  Constitutional: He is oriented to person, place, and time. He appears well-developed and well-nourished. He is active and cooperative. No distress.  BP 130/78   Pulse 87   Temp 98 F (36.7 C)   Resp 16   Ht 5' 8.25" (1.734 m)   Wt 165 lb 6.4 oz (75 kg)   SpO2 99%   BMI 24.97 kg/m   HENT:  Head: Normocephalic.    Right Ear: Hearing, tympanic membrane, external ear and ear canal normal.  Left Ear: Hearing, tympanic membrane, external ear and ear canal normal.  Nose: Mucosal edema present.  Mouth/Throat: Uvula is midline, oropharynx is clear and moist and mucous membranes are normal.  Eyes: Conjunctivae are normal. No scleral icterus.  Neck: Normal range of motion. Neck supple. No thyromegaly present.  Cardiovascular: Normal rate, regular rhythm and normal heart sounds.  Pulses:  Radial pulses are 2+ on the right side, and 2+ on the left side.  Pulmonary/Chest: Effort normal and breath sounds normal.  Lymphadenopathy:       Head (right side): No submental, no submandibular, no tonsillar, no preauricular, no posterior auricular and no occipital adenopathy present.       Head (left side): No submental, no submandibular, no tonsillar, no preauricular, no posterior auricular and no occipital adenopathy present.    He has no cervical adenopathy.       Right: No  supraclavicular adenopathy present.       Left: No supraclavicular adenopathy present.  Neurological: He is alert and oriented to person, place, and time. No sensory deficit.  Skin: Skin is warm, dry and intact. No rash noted. No cyanosis or erythema. Nails show no clubbing.  Psychiatric: He has a normal mood and affect. His speech is normal and behavior is normal.       Assessment & Plan:   Problem List Items Addressed This Visit    Heartburn    Increased symptoms with LPR. Continue esomeprazole. Add ranitidine BID. Evaluate with GI.      Relevant Medications   esomeprazole (NEXIUM) 40 MG capsule   ranitidine (ZANTAC) 150 MG tablet   Seasonal allergic rhinitis    Start azelastine and Flonase. When symptoms improved, stop azelastine. Minimize oral antihistamine to reduce dry eye symptoms. If no improvement, would add montelukast.      Relevant Medications   azelastine (ASTELIN) 0.1 % nasal spray   fluticasone (FLONASE) 50 MCG/ACT nasal spray   Dry eye    Increase oral hydration. Add Systane drops. Reduce use of oral antihistamine when able.       Other Visit Diagnoses    Tick bite of head, initial encounter    -  Primary   Await labs. Cover for tick-borne illness. Anticipatory guidance.   Relevant Medications   doxycycline (VIBRAMYCIN) 100 MG capsule   Other Relevant Orders   Lyme Ab/Western Blot Reflex   Rickettsial Fever Group IgG/M   Bloating       Relevant Orders   Ambulatory referral to Gastroenterology   Screening for colon cancer       Relevant Orders   Ambulatory referral to Gastroenterology       Return if symptoms worsen or fail to improve.   Fernande Bras, PA-C Primary Care at [facility]

## 2017-07-18 NOTE — Patient Instructions (Addendum)
Drink at least 64 ounces of water daily. Use the Systane eye drops recommended by your eye specialist. Use the Azelastine nasal spray 5-10 minutes before the Flonase, and then stop use when your symptoms are improved. CONTINUE the Flonase for MAINTENANCE. If it's not helpful, we can try adding montelukast. Once your symptoms are improved, try stopping the cetirizine (Zyrtec), as is can contribute to dry eyes.    IF you received an x-ray today, you will receive an invoice from [facility]. Please contact [facility] at [phone] with questions or concerns regarding your invoice.   IF you received labwork today, you will receive an invoice from [place]. Please contact [facility] at [phone] with questions or concerns regarding your invoice.   Our billing staff will not be able to assist you with questions regarding bills from these companies.  You will be contacted with the lab results as soon as they are available. The fastest way to get your results is to activate your My Chart account. Instructions are located on the last page of this paperwork. If you have not heard from [place] regarding the results in 2 weeks, please contact this office.

## 2017-07-18 NOTE — Assessment & Plan Note (Signed)
Start azelastine and Flonase. When symptoms improved, stop azelastine. Minimize oral antihistamine to reduce dry eye symptoms. If no improvement, would add montelukast.

## 2017-07-18 NOTE — Assessment & Plan Note (Signed)
Increased symptoms with LPR. Continue esomeprazole. Add ranitidine BID. Evaluate with GI.

## 2017-07-18 NOTE — Assessment & Plan Note (Signed)
Increase oral hydration. Add Systane drops. Reduce use of oral antihistamine when able.

## 2017-07-19 LAB — RICKETTSIAL FEVER GROUP IGG/M
Spotted Fever Group IgM: <1:64 {titer}
Typhus Fever Group IgG: <1:64 {titer}
Typhus Fever Group IgM: <1:64 {titer}

## 2017-07-19 LAB — LYME AB/WESTERN BLOT REFLEX: LYME DISEASE AB, QUANT, IGM: <0.8 index (ref 0.00–0.79)

## 2017-07-31 ENCOUNTER — Encounter: Payer: Self-pay | Admitting: Gastroenterology

## 2017-08-15 DIAGNOSIS — R509 Fever, unspecified: Secondary | ICD-10-CM | POA: Diagnosis not present

## 2017-08-15 DIAGNOSIS — J189 Pneumonia, unspecified organism: Secondary | ICD-10-CM | POA: Diagnosis not present

## 2017-09-02 DIAGNOSIS — K219 Gastro-esophageal reflux disease without esophagitis: Secondary | ICD-10-CM | POA: Diagnosis not present

## 2017-09-18 ENCOUNTER — Ambulatory Visit: Payer: Self-pay | Admitting: Gastroenterology

## 2017-09-18 ENCOUNTER — Encounter

## 2017-09-19 DIAGNOSIS — K648 Other hemorrhoids: Secondary | ICD-10-CM | POA: Diagnosis not present

## 2017-09-19 DIAGNOSIS — K219 Gastro-esophageal reflux disease without esophagitis: Secondary | ICD-10-CM | POA: Diagnosis not present

## 2017-09-19 DIAGNOSIS — K449 Diaphragmatic hernia without obstruction or gangrene: Secondary | ICD-10-CM | POA: Diagnosis not present

## 2017-09-19 DIAGNOSIS — I10 Essential (primary) hypertension: Secondary | ICD-10-CM | POA: Diagnosis not present

## 2017-09-19 DIAGNOSIS — K621 Rectal polyp: Secondary | ICD-10-CM | POA: Diagnosis not present

## 2017-09-19 DIAGNOSIS — Z1211 Encounter for screening for malignant neoplasm of colon: Secondary | ICD-10-CM | POA: Diagnosis not present

## 2017-09-19 DIAGNOSIS — Z79899 Other long term (current) drug therapy: Secondary | ICD-10-CM | POA: Diagnosis not present

## 2017-09-19 DIAGNOSIS — K5730 Diverticulosis of large intestine without perforation or abscess without bleeding: Secondary | ICD-10-CM | POA: Diagnosis not present

## 2017-09-19 DIAGNOSIS — K210 Gastro-esophageal reflux disease with esophagitis: Secondary | ICD-10-CM | POA: Diagnosis not present

## 2017-09-19 DIAGNOSIS — K644 Residual hemorrhoidal skin tags: Secondary | ICD-10-CM | POA: Diagnosis not present

## 2017-10-18 ENCOUNTER — Other Ambulatory Visit: Payer: Self-pay

## 2017-10-18 ENCOUNTER — Encounter: Payer: Self-pay | Admitting: Physician Assistant

## 2017-10-18 ENCOUNTER — Ambulatory Visit (INDEPENDENT_AMBULATORY_CARE_PROVIDER_SITE_OTHER): Payer: BLUE CROSS/BLUE SHIELD

## 2017-10-18 ENCOUNTER — Ambulatory Visit: Payer: BLUE CROSS/BLUE SHIELD | Admitting: Physician Assistant

## 2017-10-18 VITALS — BP 118/62 | HR 79 | Temp 99.3°F | Resp 18 | Ht 68.25 in | Wt 169.0 lb

## 2017-10-18 DIAGNOSIS — M7989 Other specified soft tissue disorders: Secondary | ICD-10-CM | POA: Diagnosis not present

## 2017-10-18 DIAGNOSIS — R202 Paresthesia of skin: Secondary | ICD-10-CM | POA: Diagnosis not present

## 2017-10-18 DIAGNOSIS — Z8701 Personal history of pneumonia (recurrent): Secondary | ICD-10-CM

## 2017-10-18 DIAGNOSIS — J189 Pneumonia, unspecified organism: Secondary | ICD-10-CM | POA: Diagnosis not present

## 2017-10-18 DIAGNOSIS — I10 Essential (primary) hypertension: Secondary | ICD-10-CM | POA: Diagnosis not present

## 2017-10-18 NOTE — Progress Notes (Signed)
Mike Olson  MRN: [id number] DOB: August 24, 1965  PCP: Mancel Bale, PA-C  Chief Complaint  Patient presents with  . Joint Swelling    both ankles swelling since friday also having some finger numbness     Subjective:  Pt presents to clinic for swelling in his feet for the last week.  Seems to be worse at night.  It is mostly gone in the morning. He thinks he probably drank more ETOH and ate more salt.  He has been back from the beach approximately 3 days and overall the swelling has gotten much better.  He currently has no swelling today at his appointment.  About a month right index finger used to spray paint with a paint can next morning had paresthesias in right index finger pad that have gotten better though not resolved.- right handed   PNA- 1 month ago -diagnosed at an urgent care and [place] was put on antibiotics the patient does not know the name of antibiotic.  He is concerned and would like to have an x-ray today to make sure the pneumonia has resolved.  He was switched from lisinopril to Norvasc due to possible side effects.  He has been on the Norvasc approximately 2 weeks.  History is obtained by patient.  Review of Systems  Cardiovascular: Positive for leg swelling. Negative for chest pain and palpitations.    Patient Active Problem List   Diagnosis Date Noted  . Asthma due to seasonal allergies 07/18/2017  . Seasonal allergic rhinitis 07/18/2017  . Dry eye 07/18/2017  . Benign essential HTN 03/17/2015  . Heartburn 02/17/2015    Current Outpatient Medications on File Prior to Visit  Medication Sig Dispense Refill  . amLODipine (NORVASC) 10 MG tablet Take 10 mg by mouth daily.  3  . azelastine (ASTELIN) 0.1 % nasal spray Place 2 sprays into both nostrils 2 (two) times daily. Use in each nostril as directed 30 mL 0  . esomeprazole (NEXIUM) 40 MG capsule Take 1 capsule (40 mg total) by mouth daily. 90 capsule 4  . fluticasone (FLONASE) 50 MCG/ACT nasal  spray Place 2 sprays into both nostrils daily. 16 g 12  . ibuprofen (ADVIL,MOTRIN) 200 MG tablet Take 600 mg by mouth every 6 (six) hours as needed (pain).    . ranitidine (ZANTAC) 150 MG tablet Take 1 tablet (150 mg total) by mouth 2 (two) times daily. 60 tablet 0   No current facility-administered medications on file prior to visit.     Allergies  Allergen Reactions  . Penicillins Other (See Comments)    unknown    Past Medical History:  Diagnosis Date  . Allergy   . PONV (postoperative nausea and vomiting)    Social History   Social History Narrative   Remarried-lives with his wife   Children - 2   Exercises daily at lunch hours, usually walks a few miles   [employer] - Eats fired food occasionally but mostly grilled foods and salads.    Social History   Tobacco Use  . Smoking status: Never Smoker  . Smokeless tobacco: Current User    Types: Chew  Substance Use Topics  . Alcohol use: Yes    Comment: " a couple drinks per night"  . Drug use: No   family history includes Alzheimer's disease in his father; Arthritis in his mother; Hyperlipidemia in his father; Hypertension in his father; Parkinson's disease in his father.     Objective:  BP 118/62   Pulse  79   Temp 99.3 F (37.4 C) (Oral)   Resp 18   Ht 5' 8.25" (1.734 m)   Wt 169 lb (76.7 kg)   SpO2 98%   BMI 25.51 kg/m  Body mass index is 25.51 kg/m.  Wt Readings from Last 3 Encounters:  10/18/17 169 lb (76.7 kg)  07/18/17 165 lb 6.4 oz (75 kg)  01/31/17 169 lb 9.6 oz (76.9 kg)    Physical Exam  Constitutional: He is oriented to person, place, and time.  HENT:  Head: Normocephalic and atraumatic.  Right Ear: External ear normal.  Left Ear: External ear normal.  Eyes: Conjunctivae are normal.  Neck: Normal range of motion.  Cardiovascular: Normal rate, regular rhythm, normal heart sounds and intact distal pulses.  Pulmonary/Chest: Effort normal and breath sounds normal. He has no wheezes.    Musculoskeletal:       Right lower leg: He exhibits no edema.       Left lower leg: He exhibits no edema.  No skin calluses seen on right index finger pad.  Monofilament skin testing equal bilaterally among all fingers.  Good handgrip.  Neurological: He is alert and oriented to person, place, and time.  Skin: Skin is warm and dry.  Psychiatric: Judgment normal.    Assessment and Plan :  Benign essential HTN - Plan: CMP14+EGFR -well-controlled currently on Norvasc but swelling did start about the same time as Norvasc was initiated.  He continues to swell we will stop this and start an ARB.  Leg swelling - Plan: CMP14+EGFR -check labs just to make sure everything is okay.  History of pneumonia - Plan: DG Chest 2 View -check x-ray to make sure pneumonia has resolved  Finger paresthesias-normal exam continue to monitor  Patient verbalized to me that they understand the following: diagnosis, what is being done for them, what to expect and what should be done at home.  Their questions have been answered.  See after visit summary for patient specific instructions.  Windell Hummingbird PA-C  [facility] at [place] 10/18/2017 5:51 PM  Please note: Portions of this report may have been transcribed using dragon voice recognition software. Every effort was made to ensure accuracy; however, inadvertent computerized transcription errors may be present.

## 2017-10-18 NOTE — Patient Instructions (Addendum)
   [facility] - [address], [place], [place] [address] Phone: [phone]   If you have lab work done today you will be contacted with your lab results within the next 2 weeks.  If you have not heard from us then please contact us. The fastest way to get your results is to register for My Chart.   IF you received an x-ray today, you will receive an invoice from [facility]. Please contact [facility] at [phone] with questions or concerns regarding your invoice.   IF you received labwork today, you will receive an invoice from [facility]. Please contact [facility] at [phone] with questions or concerns regarding your invoice.   Our billing staff will not be able to assist you with questions regarding bills from these companies.  You will be contacted with the lab results as soon as they are available. The fastest way to get your results is to activate your My Chart account. Instructions are located on the last page of this paperwork. If you have not heard from us regarding the results in 2 weeks, please contact this office.     

## 2017-10-19 LAB — CMP14+EGFR
ALBUMIN: 4.7 g/dL (ref 3.5–5.5)
ALT: 41 IU/L (ref 0–44)
AST: 42 IU/L — ABNORMAL HIGH (ref 0–40)
Albumin/Globulin Ratio: 1.9 (ref 1.2–2.2)
Alkaline Phosphatase: 82 IU/L (ref 39–117)
BUN / CREAT RATIO: 18 (ref 9–20)
BUN: 13 mg/dL (ref 6–24)
Bilirubin Total: 0.7 mg/dL (ref 0.0–1.2)
CALCIUM: 9.2 mg/dL (ref 8.7–10.2)
CO2: 22 mmol/L (ref 20–29)
Chloride: 102 mmol/L (ref 96–106)
Creatinine, Ser: 0.73 mg/dL — ABNORMAL LOW (ref 0.76–1.27)
GFR, EST AFRICAN AMERICAN: 125 mL/min/{1.73_m2} (ref >59)
GFR, EST NON AFRICAN AMERICAN: 108 mL/min/{1.73_m2} (ref >59)
GLOBULIN, TOTAL: 2.5 g/dL (ref 1.5–4.5)
Glucose: 96 mg/dL (ref 65–99)
Potassium: 3.7 mmol/L (ref 3.5–5.2)
SODIUM: 142 mmol/L (ref 134–144)
TOTAL PROTEIN: 7.2 g/dL (ref 6.0–8.5)

## 2017-10-24 DIAGNOSIS — K219 Gastro-esophageal reflux disease without esophagitis: Secondary | ICD-10-CM | POA: Diagnosis not present

## 2017-11-28 ENCOUNTER — Encounter: Payer: Self-pay | Admitting: Physician Assistant

## 2017-11-29 ENCOUNTER — Telehealth: Payer: Self-pay | Admitting: Physician Assistant

## 2017-11-29 NOTE — Telephone Encounter (Signed)
Copied from CRM [id number]. Topic: General - Other >> Nov 28, 2017  3:29 PM Jaquita Rector A wrote: Reason for CRM: Patient called to say that amLODipine (NORVASC) 10 MG tablet is causing his ankle to swell he is asking for a change of medication. Requesting that Rx is sent to pharmacy [facility] [address] [place] - [address] [phone] (Phone) [phone] (Fax)  Patient also request a call back please.

## 2017-12-01 ENCOUNTER — Encounter: Payer: Self-pay | Admitting: Physician Assistant

## 2017-12-01 MED ORDER — OLMESARTAN MEDOXOMIL 20 MG PO TABS: 20.0000 mg | ORAL_TABLET | Freq: Every day | ORAL | 0 refills | Status: AC

## 2017-12-01 NOTE — Telephone Encounter (Signed)
We will switch to benicar - he will need a recheck in 1 month

## 2017-12-07 DIAGNOSIS — R05 Cough: Secondary | ICD-10-CM | POA: Diagnosis not present

## 2017-12-07 DIAGNOSIS — J324 Chronic pansinusitis: Secondary | ICD-10-CM | POA: Diagnosis not present

## 2018-05-15 DIAGNOSIS — J0100 Acute maxillary sinusitis, unspecified: Secondary | ICD-10-CM | POA: Diagnosis not present

## 2018-05-15 DIAGNOSIS — J029 Acute pharyngitis, unspecified: Secondary | ICD-10-CM | POA: Diagnosis not present

## 2018-08-05 DIAGNOSIS — R1110 Vomiting, unspecified: Secondary | ICD-10-CM | POA: Diagnosis not present

## 2018-08-05 DIAGNOSIS — R1084 Generalized abdominal pain: Secondary | ICD-10-CM | POA: Diagnosis not present

## 2018-08-05 DIAGNOSIS — R05 Cough: Secondary | ICD-10-CM | POA: Diagnosis not present

## 2018-08-05 DIAGNOSIS — R509 Fever, unspecified: Secondary | ICD-10-CM | POA: Diagnosis not present

## 2018-10-09 DIAGNOSIS — R740 Nonspecific elevation of levels of transaminase and lactic acid dehydrogenase [LDH]: Secondary | ICD-10-CM | POA: Diagnosis not present

## 2018-10-09 DIAGNOSIS — K5730 Diverticulosis of large intestine without perforation or abscess without bleeding: Secondary | ICD-10-CM | POA: Diagnosis not present

## 2018-10-09 DIAGNOSIS — R509 Fever, unspecified: Secondary | ICD-10-CM | POA: Diagnosis not present

## 2018-10-09 DIAGNOSIS — K859 Acute pancreatitis without necrosis or infection, unspecified: Secondary | ICD-10-CM | POA: Diagnosis not present

## 2018-10-09 DIAGNOSIS — F1010 Alcohol abuse, uncomplicated: Secondary | ICD-10-CM | POA: Diagnosis not present

## 2018-10-09 DIAGNOSIS — K852 Alcohol induced acute pancreatitis without necrosis or infection: Secondary | ICD-10-CM | POA: Diagnosis not present

## 2018-10-09 DIAGNOSIS — Z9049 Acquired absence of other specified parts of digestive tract: Secondary | ICD-10-CM | POA: Diagnosis not present

## 2018-10-09 DIAGNOSIS — Z880 Allergy status to penicillin: Secondary | ICD-10-CM | POA: Diagnosis not present

## 2018-10-09 DIAGNOSIS — I10 Essential (primary) hypertension: Secondary | ICD-10-CM | POA: Diagnosis not present

## 2018-10-09 DIAGNOSIS — E876 Hypokalemia: Secondary | ICD-10-CM | POA: Diagnosis not present

## 2018-10-09 DIAGNOSIS — R748 Abnormal levels of other serum enzymes: Secondary | ICD-10-CM | POA: Diagnosis not present

## 2018-10-17 ENCOUNTER — Telehealth: Payer: Self-pay | Admitting: Emergency Medicine

## 2018-10-17 DIAGNOSIS — K219 Gastro-esophageal reflux disease without esophagitis: Secondary | ICD-10-CM

## 2018-10-17 NOTE — Telephone Encounter (Signed)
I have set up a TOC with Dr. Mitchel Honour on 10/27/2018. Pt is wanting to know if he can get his nexium refilled for acid reflux to last until his appt. He knows he has to see the Dr. To get the other medications refilled. Pt is wanting the prescription sent to the [facility]. Address is [place]. Pt would like a phone call as soon as this has been handled. Number on file is correct.

## 2018-10-23 MED ORDER — ESOMEPRAZOLE MAGNESIUM 40 MG PO CPDR: 40.0000 mg | DELAYED_RELEASE_CAPSULE | Freq: Every day | ORAL | 0 refills | Status: AC

## 2018-10-23 NOTE — Telephone Encounter (Signed)
Attempted to call pt. No answer. I sent in 14 day supply to [place] at [facility].

## 2018-10-24 DIAGNOSIS — K859 Acute pancreatitis without necrosis or infection, unspecified: Secondary | ICD-10-CM | POA: Diagnosis not present

## 2018-10-24 DIAGNOSIS — I10 Essential (primary) hypertension: Secondary | ICD-10-CM | POA: Diagnosis not present

## 2018-10-24 DIAGNOSIS — K219 Gastro-esophageal reflux disease without esophagitis: Secondary | ICD-10-CM | POA: Diagnosis not present

## 2018-10-27 ENCOUNTER — Encounter: Payer: Self-pay | Admitting: Emergency Medicine

## 2018-11-05 DIAGNOSIS — K8591 Acute pancreatitis with uninfected necrosis, unspecified: Secondary | ICD-10-CM | POA: Diagnosis not present

## 2018-11-11 DIAGNOSIS — K8020 Calculus of gallbladder without cholecystitis without obstruction: Secondary | ICD-10-CM | POA: Diagnosis not present

## 2018-11-11 DIAGNOSIS — R945 Abnormal results of liver function studies: Secondary | ICD-10-CM | POA: Diagnosis not present

## 2018-11-11 DIAGNOSIS — N281 Cyst of kidney, acquired: Secondary | ICD-10-CM | POA: Diagnosis not present

## 2018-12-18 DIAGNOSIS — M7701 Medial epicondylitis, right elbow: Secondary | ICD-10-CM | POA: Diagnosis not present

## 2018-12-18 DIAGNOSIS — L821 Other seborrheic keratosis: Secondary | ICD-10-CM | POA: Diagnosis not present

## 2018-12-18 DIAGNOSIS — L814 Other melanin hyperpigmentation: Secondary | ICD-10-CM | POA: Diagnosis not present

## 2018-12-18 DIAGNOSIS — L918 Other hypertrophic disorders of the skin: Secondary | ICD-10-CM | POA: Diagnosis not present

## 2018-12-18 DIAGNOSIS — B078 Other viral warts: Secondary | ICD-10-CM | POA: Diagnosis not present

## 2019-04-08 DIAGNOSIS — R109 Unspecified abdominal pain: Secondary | ICD-10-CM | POA: Diagnosis not present

## 2019-04-08 DIAGNOSIS — K219 Gastro-esophageal reflux disease without esophagitis: Secondary | ICD-10-CM | POA: Diagnosis not present

## 2019-04-08 DIAGNOSIS — R1084 Generalized abdominal pain: Secondary | ICD-10-CM | POA: Diagnosis not present

## 2019-04-12 ENCOUNTER — Emergency Department (HOSPITAL_COMMUNITY): Payer: BC Managed Care – PPO

## 2019-04-12 ENCOUNTER — Encounter (HOSPITAL_COMMUNITY): Payer: Self-pay | Admitting: *Deleted

## 2019-04-12 ENCOUNTER — Other Ambulatory Visit: Payer: Self-pay

## 2019-04-12 ENCOUNTER — Inpatient Hospital Stay (HOSPITAL_COMMUNITY)
Admission: EM | Admit: 2019-04-12 | Discharge: 2019-04-15 | DRG: 418 | Disposition: A | Discharge status: Home / Self Care | Payer: BC Managed Care – PPO | Attending: Family Medicine | Admitting: Family Medicine

## 2019-04-12 DIAGNOSIS — Z8261 Family history of arthritis: Secondary | ICD-10-CM | POA: Diagnosis not present

## 2019-04-12 DIAGNOSIS — Z880 Allergy status to penicillin: Secondary | ICD-10-CM

## 2019-04-12 DIAGNOSIS — Z8249 Family history of ischemic heart disease and other diseases of the circulatory system: Secondary | ICD-10-CM

## 2019-04-12 DIAGNOSIS — Z8349 Family history of other endocrine, nutritional and metabolic diseases: Secondary | ICD-10-CM | POA: Diagnosis not present

## 2019-04-12 DIAGNOSIS — Z20822 Contact with and (suspected) exposure to covid-19: Secondary | ICD-10-CM | POA: Diagnosis not present

## 2019-04-12 DIAGNOSIS — Z820 Family history of epilepsy and other diseases of the nervous system: Secondary | ICD-10-CM

## 2019-04-12 DIAGNOSIS — K8066 Calculus of gallbladder and bile duct with acute and chronic cholecystitis without obstruction: Secondary | ICD-10-CM | POA: Diagnosis not present

## 2019-04-12 DIAGNOSIS — K8010 Calculus of gallbladder with chronic cholecystitis without obstruction: Secondary | ICD-10-CM | POA: Diagnosis present

## 2019-04-12 DIAGNOSIS — Z79899 Other long term (current) drug therapy: Secondary | ICD-10-CM | POA: Diagnosis not present

## 2019-04-12 DIAGNOSIS — J45909 Unspecified asthma, uncomplicated: Secondary | ICD-10-CM | POA: Diagnosis not present

## 2019-04-12 DIAGNOSIS — K8012 Calculus of gallbladder with acute and chronic cholecystitis without obstruction: Secondary | ICD-10-CM | POA: Diagnosis not present

## 2019-04-12 DIAGNOSIS — I10 Essential (primary) hypertension: Secondary | ICD-10-CM | POA: Diagnosis not present

## 2019-04-12 DIAGNOSIS — K219 Gastro-esophageal reflux disease without esophagitis: Secondary | ICD-10-CM | POA: Diagnosis not present

## 2019-04-12 DIAGNOSIS — K851 Biliary acute pancreatitis without necrosis or infection: Secondary | ICD-10-CM | POA: Diagnosis not present

## 2019-04-12 DIAGNOSIS — K811 Chronic cholecystitis: Secondary | ICD-10-CM | POA: Diagnosis not present

## 2019-04-12 DIAGNOSIS — K8020 Calculus of gallbladder without cholecystitis without obstruction: Secondary | ICD-10-CM | POA: Diagnosis not present

## 2019-04-12 DIAGNOSIS — F17220 Nicotine dependence, chewing tobacco, uncomplicated: Secondary | ICD-10-CM | POA: Diagnosis present

## 2019-04-12 DIAGNOSIS — N281 Cyst of kidney, acquired: Secondary | ICD-10-CM | POA: Diagnosis not present

## 2019-04-12 LAB — COMPREHENSIVE METABOLIC PANEL
ALT: 330 U/L — ABNORMAL HIGH (ref 0–44)
AST: 188 U/L — ABNORMAL HIGH (ref 15–41)
Albumin: 3.9 g/dL (ref 3.5–5.0)
Alkaline Phosphatase: 239 U/L — ABNORMAL HIGH (ref 38–126)
Anion gap: 14 (ref 5–15)
BUN: 14 mg/dL (ref 6–20)
CO2: 20 mmol/L — ABNORMAL LOW (ref 22–32)
Calcium: 9.3 mg/dL (ref 8.9–10.3)
Chloride: 106 mmol/L (ref 98–111)
Creatinine, Ser: 1.02 mg/dL (ref 0.61–1.24)
GFR calc Af Amer: >60 mL/min (ref >60)
GFR calc non Af Amer: >60 mL/min (ref >60)
Glucose, Bld: 120 mg/dL — ABNORMAL HIGH (ref 70–99)
Potassium: 3.5 mmol/L (ref 3.5–5.1)
Sodium: 140 mmol/L (ref 135–145)
Total Bilirubin: 3.7 mg/dL — ABNORMAL HIGH (ref 0.3–1.2)
Total Protein: 6.7 g/dL (ref 6.5–8.1)

## 2019-04-12 LAB — URINALYSIS, ROUTINE W REFLEX MICROSCOPIC
Glucose, UA: NEGATIVE mg/dL
Hgb urine dipstick: NEGATIVE
Ketones, ur: 80 mg/dL — AB
Leukocytes,Ua: NEGATIVE
Nitrite: NEGATIVE
Protein, ur: 30 mg/dL — AB
Specific Gravity, Urine: 1.025 (ref 1.005–1.030)
pH: 5 (ref 5.0–8.0)

## 2019-04-12 LAB — CBC
HCT: 44.1 % (ref 39.0–52.0)
Hemoglobin: 14.8 g/dL (ref 13.0–17.0)
MCH: 30.4 pg (ref 26.0–34.0)
MCHC: 33.6 g/dL (ref 30.0–36.0)
MCV: 90.6 fL (ref 80.0–100.0)
Platelets: 258 10*3/uL (ref 150–400)
RBC: 4.87 MIL/uL (ref 4.22–5.81)
RDW: 12.4 % (ref 11.5–15.5)
WBC: 16.6 10*3/uL — ABNORMAL HIGH (ref 4.0–10.5)
nRBC: 0 % (ref 0.0–0.2)

## 2019-04-12 MED ORDER — METOCLOPRAMIDE HCL 5 MG/ML IJ SOLN
10.0000 mg | INTRAMUSCULAR | Status: AC
  Administered 2019-04-12: 10 mg via INTRAVENOUS
  Filled 2019-04-12: qty 2

## 2019-04-12 MED ORDER — SODIUM CHLORIDE 0.9 % IV BOLUS
1000.0000 mL | Freq: Once | INTRAVENOUS | Status: AC
  Administered 2019-04-12: 23:00:00 1000 mL via INTRAVENOUS

## 2019-04-12 MED ORDER — LACTATED RINGERS IV BOLUS
1000.0000 mL | Freq: Once | INTRAVENOUS | Status: AC
  Administered 2019-04-13: 01:00:00 1000 mL via INTRAVENOUS

## 2019-04-12 MED ORDER — FENTANYL CITRATE (PF) 100 MCG/2ML IJ SOLN
50.0000 ug | Freq: Once | INTRAMUSCULAR | Status: AC
  Administered 2019-04-12: 50 ug via INTRAVENOUS
  Filled 2019-04-12: qty 2

## 2019-04-12 MED ORDER — SODIUM CHLORIDE 0.9% FLUSH: 3.0000 mL | Freq: Once | INTRAVENOUS | Status: DC

## 2019-04-12 NOTE — ED Notes (Signed)
Pt returned from ultrasound

## 2019-04-12 NOTE — ED Triage Notes (Signed)
The pt has had known gallstones since June  He is havinf more frequent episodes of the pain for the past 2 months  Pain worse for the past 2 days nausea no vomiting or diarrhea

## 2019-04-12 NOTE — ED Notes (Signed)
Pt being taken to ultrasound.

## 2019-04-12 NOTE — ED Provider Notes (Signed)
[facility] EMERGENCY DEPARTMENT Provider Note   CSN: [id number] Arrival date & time: 04/12/19  2140     History Chief Complaint  Patient presents with  . Abdominal Pain    Mike Olson is a 54 y.o. male.  54 year old male with hx of HTN, GERD presents to the emergency department for evaluation of abdominal pain.  He reports being admitted to the hospital in June 2020 for pancreatitis.  Was later seen by his primary care doctor who did an outpatient ultrasound that revealed gallstones.  He used to drink more heavily, but has only had approximately 2 beers since June; nothing recently.  Has been experiencing intermittent, waxing and waning epigastric abdominal pain radiating around to his right upper abdomen and back.  Usually takes Tylenol PM or ibuprofen for pain.  Tried a tablet of leftover hydrocodone today, but feels that it only has been "masking" his discomfort.  Continues to feel like there is a band around his abdomen.  Pain has been constant since this afternoon.  Symptoms associated with nausea.  He has had some intermittent emesis during the week associated with bouts of pain, but none today.  Denies any bowel changes.  Notes subjective fever, but no recorded temperatures over 100.4 F.  No prior history of abdominal surgeries.  The history is provided by the patient. No language interpreter was used.  Abdominal Pain      Past Medical History:  Diagnosis Date  . Allergy   . PONV (postoperative nausea and vomiting)     Patient Active Problem List   Diagnosis Date Noted  . Acute gallstone pancreatitis 04/13/2019  . Asthma due to seasonal allergies 07/18/2017  . Seasonal allergic rhinitis 07/18/2017  . Dry eye 07/18/2017  . Benign essential HTN 03/17/2015  . Heartburn 02/17/2015    Past Surgical History:  Procedure Laterality Date  . APPENDECTOMY    . HERNIA REPAIR    . KNEE SURGERY Left   . LAPAROSCOPIC APPENDECTOMY N/A 04/24/2013   Procedure: APPENDECTOMY LAPAROSCOPIC;  Surgeon: Velora Heckler, MD;  Location: [facility];  Service: General;  Laterality: N/A;  . SHOULDER SURGERY         Family History  Problem Relation Age of Onset  . Hypertension Father   . Hyperlipidemia Father   . Alzheimer's disease Father   . Parkinson's disease Father   . Arthritis Mother        RA    Social History   Tobacco Use  . Smoking status: Never Smoker  . Smokeless tobacco: Current User    Types: Chew  Substance Use Topics  . Alcohol use: Yes    Comment: " a couple drinks per night"  . Drug use: No    Home Medications Prior to Admission medications   Medication Sig Start Date End Date Taking? Authorizing Provider  esomeprazole (NEXIUM) 40 MG capsule Take 1 capsule (40 mg total) by mouth daily. 10/23/18  Yes Sagardia, Eilleen Kempf, MD  olmesartan (BENICAR) 20 MG tablet Take 1 tablet (20 mg total) by mouth daily. 12/01/17  Yes Weber, Dema Severin, PA-C    Allergies    Penicillins  Review of Systems   Review of Systems  Gastrointestinal: Positive for abdominal pain.  Ten systems reviewed and are negative for acute change, except as noted in the HPI.    Physical Exam Updated Vital Signs BP (!) 133/115 (BP Location: Left Arm)   Pulse 94   Temp 98.5 F (36.9 C) (Oral)  Resp 18   Ht 5\' 9"  (1.753 m)   Wt 76.7 kg   SpO2 96%   BMI 24.97 kg/m   Physical Exam Vitals and nursing note reviewed.  Constitutional:      General: He is not in acute distress.    Appearance: He is well-developed. He is not diaphoretic.     Comments: Nontoxic-appearing and in no distress  HENT:     Head: Normocephalic and atraumatic.  Eyes:     General: No scleral icterus.    Conjunctiva/sclera: Conjunctivae normal.  Cardiovascular:     Rate and Rhythm: Normal rate and regular rhythm.     Pulses: Normal pulses.  Pulmonary:     Effort: Pulmonary effort is normal. No respiratory distress.     Comments: Respirations even and  unlabored Abdominal:     Palpations: There is no mass.     Tenderness: There is abdominal tenderness. There is guarding.     Comments: Tenderness to palpation in the epigastrium as well as the right upper quadrant with positive Murphy sign.  Musculoskeletal:        General: Normal range of motion.     Cervical back: Normal range of motion.  Skin:    General: Skin is warm and dry.     Coloration: Skin is not pale.     Findings: No erythema or rash.  Neurological:     Mental Status: He is alert and oriented to person, place, and time.  Psychiatric:        Behavior: Behavior normal.     ED Results / Procedures / Treatments   Labs (all labs ordered are listed, but only abnormal results are displayed) Labs Reviewed  LIPASE, BLOOD - Abnormal; Notable for the following components:      Result Value   Lipase 1,362 (*)    All other components within normal limits  COMPREHENSIVE METABOLIC PANEL - Abnormal; Notable for the following components:   CO2 20 (*)    Glucose, Bld 120 (*)    AST 188 (*)    ALT 330 (*)    Alkaline Phosphatase 239 (*)    Total Bilirubin 3.7 (*)    All other components within normal limits  CBC - Abnormal; Notable for the following components:   WBC 16.6 (*)    All other components within normal limits  URINALYSIS, ROUTINE W REFLEX MICROSCOPIC - Abnormal; Notable for the following components:   Color, Urine AMBER (*)    Bilirubin Urine SMALL (*)    Ketones, ur 80 (*)    Protein, ur 30 (*)    Bacteria, UA FEW (*)    All other components within normal limits  RESPIRATORY PANEL BY RT PCR (FLU A&B, COVID)  COMPREHENSIVE METABOLIC PANEL  CBC  HIV ANTIBODY (ROUTINE TESTING W REFLEX)  LIPID PANEL    EKG None  Radiology Abdomen Limited  Result Date: 04/12/2019 CLINICAL DATA:  Right upper quadrant pain EXAM: ULTRASOUND ABDOMEN LIMITED RIGHT UPPER QUADRANT COMPARISON:  None. FINDINGS: Gallbladder: Small layering stones are seen within the gallbladder  fundus the largest measuring 6 mm. There appears to be mild prominence of the gallbladder wall measuring up to 3.6 mm. No sonographic 06/10/2019 sign is seen. No pericholecystic fluid. Common bile duct: Diameter: 3.4 mm Liver: No focal lesion identified. Within normal limits in parenchymal echogenicity. Portal vein is patent on color Doppler imaging with normal direction of blood flow towards the liver. Other: None. IMPRESSION: Cholelithiasis with a mildly prominent gallbladder wall,  which could be due to under distension or biliary contraction. No definite evidence of acute cholecystitis. Electronically Signed   By: Prudencio Pair M.D.   On: 04/12/2019 23:37    Procedures Procedures (including critical care time)  Medications Ordered in ED Medications  sodium chloride flush (NS) 0.9 % injection 3 mL (has no administration in time range)  morphine 2 MG/ML injection 2-4 mg (has no administration in time range)  pantoprazole (PROTONIX) EC tablet 80 mg (has no administration in time range)  meropenem (MERREM) 1 g in sodium chloride 0.9 % 100 mL IVPB (has no administration in time range)  acetaminophen (TYLENOL) tablet 650 mg (has no administration in time range)    Or  acetaminophen (TYLENOL) suppository 650 mg (has no administration in time range)  ondansetron (ZOFRAN) tablet 4 mg (has no administration in time range)    Or  ondansetron (ZOFRAN) injection 4 mg (has no administration in time range)  enoxaparin (LOVENOX) injection 40 mg (has no administration in time range)  dextrose 5 %-0.45 % sodium chloride infusion (has no administration in time range)  sodium chloride 0.9 % bolus 1,000 mL (0 mLs Intravenous Stopped 04/13/19 0058)  metoCLOPramide (REGLAN) injection 10 mg (10 mg Intravenous Given 04/12/19 2255)  fentaNYL (SUBLIMAZE) injection 50 mcg (50 mcg Intravenous Given 04/12/19 2254)  lactated ringers bolus 1,000 mL (1,000 mLs Intravenous New Bag/Given 04/13/19 0129)  iohexol (OMNIPAQUE) 300 MG/ML  solution 100 mL (100 mLs Intravenous Contrast Given 04/13/19 0106)    ED Course  I have reviewed the triage vital signs and the nursing notes.  Pertinent labs & imaging results that were available during my care of the patient were reviewed by me and considered in my medical decision making (see chart for details).  Clinical Course as of Apr 13 127  Mon Apr 13, 2019  0011 Lipase elevated per conversation with lab. Undergoing 3rd dilution; results pending.   [KH]    Clinical Course User Index [KH] Beverely Pace   MDM Rules/Calculators/A&P                      54 year old male presents to the emergency department for evaluation of abdominal pain.  Pain has been intermittent since June, present in his epigastric abdomen and radiating towards his back.  Endorses history of gallstones as well as hospitalization for acute pancreatitis in June 2020.  His work-up today is consistent with gallstone pancreatitis.  There is no CBD dilation on ultrasound, though patient would likely benefit from MRCP or ERCP for further evaluation.  His pain has been managed in the ED with fentanyl.  Declined additional medications on repeat exam.  He will be admitted to the hospitalist service; Dr. Alcario Drought to admit.   Final Clinical Impression(s) / ED Diagnoses Final diagnoses:  Gallstone pancreatitis    Rx / DC Orders ED Discharge Orders    None       Antonietta Breach, PA-C 04/13/19 0131    Ward, Delice Bison, DO 04/13/19 6503

## 2019-04-13 ENCOUNTER — Other Ambulatory Visit: Payer: Self-pay

## 2019-04-13 ENCOUNTER — Inpatient Hospital Stay (HOSPITAL_COMMUNITY): Payer: BC Managed Care – PPO

## 2019-04-13 DIAGNOSIS — N281 Cyst of kidney, acquired: Secondary | ICD-10-CM | POA: Diagnosis not present

## 2019-04-13 DIAGNOSIS — Z79899 Other long term (current) drug therapy: Secondary | ICD-10-CM | POA: Diagnosis not present

## 2019-04-13 DIAGNOSIS — I10 Essential (primary) hypertension: Secondary | ICD-10-CM

## 2019-04-13 DIAGNOSIS — Z8261 Family history of arthritis: Secondary | ICD-10-CM | POA: Diagnosis not present

## 2019-04-13 DIAGNOSIS — Z880 Allergy status to penicillin: Secondary | ICD-10-CM | POA: Diagnosis not present

## 2019-04-13 DIAGNOSIS — K851 Biliary acute pancreatitis without necrosis or infection: Principal | ICD-10-CM

## 2019-04-13 DIAGNOSIS — Z20822 Contact with and (suspected) exposure to covid-19: Secondary | ICD-10-CM | POA: Diagnosis present

## 2019-04-13 DIAGNOSIS — K8020 Calculus of gallbladder without cholecystitis without obstruction: Secondary | ICD-10-CM | POA: Diagnosis not present

## 2019-04-13 DIAGNOSIS — K8010 Calculus of gallbladder with chronic cholecystitis without obstruction: Secondary | ICD-10-CM | POA: Diagnosis present

## 2019-04-13 DIAGNOSIS — Z8249 Family history of ischemic heart disease and other diseases of the circulatory system: Secondary | ICD-10-CM | POA: Diagnosis not present

## 2019-04-13 DIAGNOSIS — Z820 Family history of epilepsy and other diseases of the nervous system: Secondary | ICD-10-CM | POA: Diagnosis not present

## 2019-04-13 DIAGNOSIS — Z8349 Family history of other endocrine, nutritional and metabolic diseases: Secondary | ICD-10-CM | POA: Diagnosis not present

## 2019-04-13 DIAGNOSIS — K8066 Calculus of gallbladder and bile duct with acute and chronic cholecystitis without obstruction: Secondary | ICD-10-CM | POA: Diagnosis not present

## 2019-04-13 DIAGNOSIS — F17220 Nicotine dependence, chewing tobacco, uncomplicated: Secondary | ICD-10-CM | POA: Diagnosis present

## 2019-04-13 LAB — COMPREHENSIVE METABOLIC PANEL
ALT: 251 U/L — ABNORMAL HIGH (ref 0–44)
AST: 130 U/L — ABNORMAL HIGH (ref 15–41)
Albumin: 3.1 g/dL — ABNORMAL LOW (ref 3.5–5.0)
Alkaline Phosphatase: 198 U/L — ABNORMAL HIGH (ref 38–126)
Anion gap: 5 (ref 5–15)
BUN: 11 mg/dL (ref 6–20)
CO2: 24 mmol/L (ref 22–32)
Calcium: 8.2 mg/dL — ABNORMAL LOW (ref 8.9–10.3)
Chloride: 108 mmol/L (ref 98–111)
Creatinine, Ser: 0.98 mg/dL (ref 0.61–1.24)
GFR calc Af Amer: >60 mL/min (ref >60)
GFR calc non Af Amer: >60 mL/min (ref >60)
Glucose, Bld: 173 mg/dL — ABNORMAL HIGH (ref 70–99)
Potassium: 4 mmol/L (ref 3.5–5.1)
Sodium: 137 mmol/L (ref 135–145)
Total Bilirubin: 3.4 mg/dL — ABNORMAL HIGH (ref 0.3–1.2)
Total Protein: 5.5 g/dL — ABNORMAL LOW (ref 6.5–8.1)

## 2019-04-13 LAB — CBC
HCT: 38 % — ABNORMAL LOW (ref 39.0–52.0)
Hemoglobin: 12.7 g/dL — ABNORMAL LOW (ref 13.0–17.0)
MCH: 30 pg (ref 26.0–34.0)
MCHC: 33.4 g/dL (ref 30.0–36.0)
MCV: 89.8 fL (ref 80.0–100.0)
Platelets: 204 10*3/uL (ref 150–400)
RBC: 4.23 MIL/uL (ref 4.22–5.81)
RDW: 12.6 % (ref 11.5–15.5)
WBC: 13.4 10*3/uL — ABNORMAL HIGH (ref 4.0–10.5)
nRBC: 0 % (ref 0.0–0.2)

## 2019-04-13 LAB — RESPIRATORY PANEL BY RT PCR (FLU A&B, COVID)
Influenza A by PCR: NEGATIVE
Influenza B by PCR: NEGATIVE
SARS Coronavirus 2 by RT PCR: NEGATIVE

## 2019-04-13 LAB — LIPID PANEL
Cholesterol: 143 mg/dL (ref 0–200)
HDL: 39 mg/dL — ABNORMAL LOW (ref >40)
LDL Cholesterol: 98 mg/dL (ref 0–99)
Total CHOL/HDL Ratio: 3.7 RATIO
Triglycerides: 32 mg/dL (ref <150)
VLDL: 6 mg/dL (ref 0–40)

## 2019-04-13 LAB — HIV ANTIBODY (ROUTINE TESTING W REFLEX): HIV Screen 4th Generation wRfx: NONREACTIVE

## 2019-04-13 LAB — LIPASE, BLOOD: Lipase: 1362 U/L — ABNORMAL HIGH (ref 11–51)

## 2019-04-13 MED ORDER — PANTOPRAZOLE SODIUM 40 MG PO TBEC
80.0000 mg | DELAYED_RELEASE_TABLET | Freq: Every day | ORAL | Status: DC
  Administered 2019-04-13 – 2019-04-14 (×2): 80 mg via ORAL
  Filled 2019-04-13 (×2): qty 2

## 2019-04-13 MED ORDER — EPINEPHRINE 0.3 MG/0.3ML IJ SOAJ
0.3000 mg | Freq: Once | INTRAMUSCULAR | Status: DC | PRN
  Filled 2019-04-13: qty 0.6

## 2019-04-13 MED ORDER — ACETAMINOPHEN 325 MG PO TABS: 650.0000 mg | ORAL_TABLET | Freq: Four times a day (QID) | ORAL | Status: DC | PRN

## 2019-04-13 MED ORDER — SODIUM CHLORIDE 0.9 % IV SOLN: INTRAVENOUS | Status: DC

## 2019-04-13 MED ORDER — ACETAMINOPHEN 650 MG RE SUPP: 650.0000 mg | Freq: Four times a day (QID) | RECTAL | Status: DC | PRN

## 2019-04-13 MED ORDER — DEXTROSE-NACL 5-0.45 % IV SOLN: INTRAVENOUS | Status: DC

## 2019-04-13 MED ORDER — SODIUM CHLORIDE 0.9 % IV SOLN
1.0000 g | Freq: Three times a day (TID) | INTRAVENOUS | Status: DC
  Administered 2019-04-13 – 2019-04-14 (×4): 1 g via INTRAVENOUS
  Filled 2019-04-13 (×6): qty 1

## 2019-04-13 MED ORDER — ONDANSETRON HCL 4 MG/2ML IJ SOLN
4.0000 mg | Freq: Four times a day (QID) | INTRAMUSCULAR | Status: DC | PRN
  Administered 2019-04-13 – 2019-04-14 (×2): 4 mg via INTRAVENOUS
  Filled 2019-04-13 (×2): qty 2

## 2019-04-13 MED ORDER — ENOXAPARIN SODIUM 40 MG/0.4ML ~~LOC~~ SOLN
40.0000 mg | SUBCUTANEOUS | Status: DC
  Administered 2019-04-13: 40 mg via SUBCUTANEOUS
  Filled 2019-04-13: qty 0.4

## 2019-04-13 MED ORDER — AMOXICILLIN 500 MG PO CAPS
500.0000 mg | ORAL_CAPSULE | Freq: Once | ORAL | Status: AC
  Administered 2019-04-13: 500 mg via ORAL
  Filled 2019-04-13: qty 1

## 2019-04-13 MED ORDER — IOHEXOL 300 MG/ML  SOLN
100.0000 mL | Freq: Once | INTRAMUSCULAR | Status: AC | PRN
  Administered 2019-04-13: 100 mL via INTRAVENOUS

## 2019-04-13 MED ORDER — DIPHENHYDRAMINE HCL 50 MG/ML IJ SOLN: 25.0000 mg | Freq: Once | INTRAMUSCULAR | Status: DC | PRN

## 2019-04-13 MED ORDER — MORPHINE SULFATE (PF) 2 MG/ML IV SOLN
2.0000 mg | INTRAVENOUS | Status: DC | PRN
  Administered 2019-04-13 (×2): 2 mg via INTRAVENOUS
  Administered 2019-04-13: 02:00:00 4 mg via INTRAVENOUS
  Administered 2019-04-14: 2 mg via INTRAVENOUS
  Administered 2019-04-14: 14:00:00 4 mg via INTRAVENOUS
  Administered 2019-04-14: 2 mg via INTRAVENOUS
  Filled 2019-04-13: qty 1
  Filled 2019-04-13 (×2): qty 2
  Filled 2019-04-13: qty 1
  Filled 2019-04-13: qty 2
  Filled 2019-04-13: qty 1

## 2019-04-13 MED ORDER — ONDANSETRON HCL 4 MG PO TABS: 4.0000 mg | ORAL_TABLET | Freq: Four times a day (QID) | ORAL | Status: DC | PRN

## 2019-04-13 NOTE — ED Notes (Signed)
Admitting doctor at the bedside 

## 2019-04-13 NOTE — Consult Note (Addendum)
[facility] Surgery Consult Note  Mike Olson May 13, 1965  [id number].    Requesting MD: Carmelia Roller Chief Complaint/Reason for Consult: gallstone pancreatitis HPI:  Patient is a 54 year old male who presented to [facility] with abdominal pain. Diagnosed with pancreatitis back in June of 2020 but patient was traveling at this time. He followed up with PCP when he got home who completed outpatient workup and patient found to have cholelithiasis. Patient was also drinking alcohol fairly heavily prior to this but has abstained from alcohol other than 2 beers since June. He was scheduled to see a surgeon in [place] next week but returned to ED yesterday with abdominal pain. He reports pain is located in epigastrium and radiates to chest and back, pain characterized as gnawing. He reports intermittent issues since June, but this current episode has progressively worsened over the last few days. Associated nausea and chills with pain, has had 2 episodes of NBNB emesis. Denies fever, SOB, palpitations, diarrhea, constipation, bloody/dark stools, urinary symptoms. PMH otherwise significant for HTN. Past abdominal surgeries include ventral hernia repair with mesh and laparoscopic appendectomy. No blood thinning medications. Allergic to PCNs. Patient denies illicit drug use or tobacco use, alcohol use as detailed above. He works for the [employer].   ROS: Review of Systems  Constitutional: Positive for chills. Negative for fever.  Respiratory: Positive for shortness of breath.   Cardiovascular: Positive for chest pain. Negative for palpitations.  Gastrointestinal: Positive for abdominal pain, nausea and vomiting. Negative for blood in stool, constipation, diarrhea and melena.  Genitourinary: Negative for dysuria, frequency and urgency.  All other systems reviewed and are negative.   Family History  Problem Relation Age of Onset  . Hypertension Father   . Hyperlipidemia Father   .  Alzheimer's disease Father   . Parkinson's disease Father   . Arthritis Mother        RA    Past Medical History:  Diagnosis Date  . Allergy   . PONV (postoperative nausea and vomiting)     Past Surgical History:  Procedure Laterality Date  . APPENDECTOMY    . HERNIA REPAIR    . KNEE SURGERY Left   . LAPAROSCOPIC APPENDECTOMY N/A 04/24/2013   Procedure: APPENDECTOMY LAPAROSCOPIC;  Surgeon: Velora Heckler, MD;  Location: [facility] ORS;  Service: General;  Laterality: N/A;  . SHOULDER SURGERY      Social History:  reports that he has never smoked. His smokeless tobacco use includes chew. He reports current alcohol use. He reports that he does not use drugs.  Allergies:  Allergies  Allergen Reactions  . Penicillins Other (See Comments)    unknown    Medications Prior to Admission  Medication Sig Dispense Refill  . esomeprazole (NEXIUM) 40 MG capsule Take 1 capsule (40 mg total) by mouth daily. 14 capsule 0  . olmesartan (BENICAR) 20 MG tablet Take 1 tablet (20 mg total) by mouth daily. 90 tablet 0    Blood pressure 92/61, pulse (!) 55, temperature 98.1 F (36.7 C), temperature source Oral, resp. rate 18, height 5\' 9"  (1.753 m), weight 76.7 kg, SpO2 96 %. Physical Exam: General: pleasant, WD, WN white male who is laying in bed in NAD HEENT:  Sclera are noninjected.  PERRL.  Ears and nose without any masses or lesions.  Mouth is pink and moist Heart: regular, rate, and rhythm.  Normal s1,s2. No obvious murmurs, gallops, or rubs noted.  Palpable radial and pedal pulses bilaterally Lungs: CTAB, no wheezes,  rhonchi, or rales noted.  Respiratory effort nonlabored Abd: soft, mildly ttp in epigastrium, some voluntary guarding, negative Murphy sign, ND, +BS, no masses, hernias, or organomegaly MS: all 4 extremities are symmetrical with no cyanosis, clubbing, or edema. Skin: warm and dry with no masses, lesions, or rashes Neuro: Cranial nerves 2-12 grossly intact, speech is normal Psych:  A&Ox3 with an appropriate affect.   Results for orders placed or performed during the hospital encounter of 04/12/19 (from the past 48 hour(s))  Lipase, blood     Status: Abnormal   Collection Time: 04/12/19  9:56 PM  Result Value Ref Range   Lipase 1,362 (H) 11 - 51 U/L    Comment: RESULTS CONFIRMED BY MANUAL DILUTION Performed at [facility], [address], [place], [place] [address]   Comprehensive metabolic panel     Status: Abnormal   Collection Time: 04/12/19  9:56 PM  Result Value Ref Range   Sodium 140 135 - 145 mmol/L   Potassium 3.5 3.5 - 5.1 mmol/L   Chloride 106 98 - 111 mmol/L   CO2 20 (L) 22 - 32 mmol/L   Glucose, Bld 120 (H) 70 - 99 mg/dL   BUN 14 6 - 20 mg/dL   Creatinine, Ser 8.56 0.61 - 1.24 mg/dL   Calcium 9.3 8.9 - 31.4 mg/dL   Total Protein 6.7 6.5 - 8.1 g/dL   Albumin 3.9 3.5 - 5.0 g/dL   AST 970 (H) 15 - 41 U/L   ALT 330 (H) 0 - 44 U/L   Alkaline Phosphatase 239 (H) 38 - 126 U/L   Total Bilirubin 3.7 (H) 0.3 - 1.2 mg/dL   GFR calc non Af Amer >60 >60 mL/min   GFR calc Af Amer >60 >60 mL/min   Anion gap 14 5 - 15    Comment: Performed at [facility], [address] [place], [place] [address]  CBC     Status: Abnormal   Collection Time: 04/12/19  9:56 PM  Result Value Ref Range   WBC 16.6 (H) 4.0 - 10.5 K/uL   RBC 4.87 4.22 - 5.81 MIL/uL   Hemoglobin 14.8 13.0 - 17.0 g/dL   HCT 58.8 50.2 - 77.4 %   MCV 90.6 80.0 - 100.0 fL   MCH 30.4 26.0 - 34.0 pg   MCHC 33.6 30.0 - 36.0 g/dL   RDW 12.8 78.6 - 76.7 %   Platelets 258 150 - 400 K/uL   nRBC 0.0 0.0 - 0.2 %    Comment: Performed at [facility], [address], [place], [place] [address]  Urinalysis, Routine w reflex microscopic     Status: Abnormal   Collection Time: 04/12/19 10:09 PM  Result Value Ref Range   Color, Urine AMBER (A) YELLOW    Comment: BIOCHEMICALS MAY BE AFFECTED BY COLOR   APPearance CLEAR CLEAR   Specific Gravity, Urine 1.025 1.005 - 1.030   pH 5.0 5.0 -  8.0   Glucose, UA NEGATIVE NEGATIVE mg/dL   Hgb urine dipstick NEGATIVE NEGATIVE   Bilirubin Urine SMALL (A) NEGATIVE   Ketones, ur 80 (A) NEGATIVE mg/dL   Protein, ur 30 (A) NEGATIVE mg/dL   Nitrite NEGATIVE NEGATIVE   Leukocytes,Ua NEGATIVE NEGATIVE   RBC / HPF 0-5 0 - 5 RBC/hpf   WBC, UA 6-10 0 - 5 WBC/hpf   Bacteria, UA FEW (A) NONE SEEN   Squamous Epithelial / LPF 0-5 0 - 5   Mucus PRESENT  Comment: Performed at [facility], [place] [address], [place], [place] [address]  Respiratory Panel by RT PCR (Flu A&B, Covid) - Nasopharyngeal Swab     Status: None   Collection Time: 04/12/19 11:11 PM   Specimen: Nasopharyngeal Swab  Result Value Ref Range   SARS Coronavirus 2 by RT PCR NEGATIVE NEGATIVE    Comment: (NOTE) SARS-CoV-2 target nucleic acids are NOT DETECTED. The SARS-CoV-2 RNA is generally detectable in upper respiratoy specimens during the acute phase of infection. The lowest concentration of SARS-CoV-2 viral copies this assay can detect is 131 copies/mL. A negative result does not preclude SARS-Cov-2 infection and should not be used as the sole basis for treatment or other patient management decisions. A negative result may occur with  improper specimen collection/handling, submission of specimen other than nasopharyngeal swab, presence of viral mutation(s) within the areas targeted by this assay, and inadequate number of viral copies (<131 copies/mL). A negative result must be combined with clinical observations, patient history, and epidemiological information. The expected result is Negative. Fact Sheet for Patients:  PinkCheek.be Fact Sheet for Healthcare Providers:  GravelBags.it This test is not yet ap proved or cleared by the [place] FDA and  has been authorized for detection and/or diagnosis of SARS-CoV-2 by FDA under an Emergency Use Authorization (EUA). This EUA will remain  in effect  (meaning this test can be used) for the duration of the COVID-19 declaration under Section 564(b)(1) of the Act, 21 U.S.C. section 360bbb-3(b)(1), unless the authorization is terminated or revoked sooner.    Influenza A by PCR NEGATIVE NEGATIVE   Influenza B by PCR NEGATIVE NEGATIVE    Comment: (NOTE) The Xpert Xpress SARS-CoV-2/FLU/RSV assay is intended as an aid in  the diagnosis of influenza from Nasopharyngeal swab specimens and  should not be used as a sole basis for treatment. Nasal washings and  aspirates are unacceptable for Xpert Xpress SARS-CoV-2/FLU/RSV  testing. Fact Sheet for Patients: PinkCheek.be Fact Sheet for Healthcare Providers: GravelBags.it This test is not yet approved or cleared by the [place] FDA and  has been authorized for detection and/or diagnosis of SARS-CoV-2 by  FDA under an Emergency Use Authorization (EUA). This EUA will remain  in effect (meaning this test can be used) for the duration of the  Covid-19 declaration under Section 564(b)(1) of the Act, 21  U.S.C. section 360bbb-3(b)(1), unless the authorization is  terminated or revoked. Performed at [facility], [place] [address], [place], [place] [address]   Comprehensive metabolic panel     Status: Abnormal   Collection Time: 04/13/19  4:25 AM  Result Value Ref Range   Sodium 137 135 - 145 mmol/L   Potassium 4.0 3.5 - 5.1 mmol/L   Chloride 108 98 - 111 mmol/L   CO2 24 22 - 32 mmol/L   Glucose, Bld 173 (H) 70 - 99 mg/dL   BUN 11 6 - 20 mg/dL   Creatinine, Ser 0.98 0.61 - 1.24 mg/dL   Calcium 8.2 (L) 8.9 - 10.3 mg/dL   Total Protein 5.5 (L) 6.5 - 8.1 g/dL   Albumin 3.1 (L) 3.5 - 5.0 g/dL   AST 130 (H) 15 - 41 U/L   ALT 251 (H) 0 - 44 U/L   Alkaline Phosphatase 198 (H) 38 - 126 U/L   Total Bilirubin 3.4 (H) 0.3 - 1.2 mg/dL   GFR calc non Af Amer >60 >60 mL/min   GFR calc Af Amer >60 >60 mL/min   Anion  gap 5 5 - 15     Comment: Performed at [facility], [address] [place], [place] [address]  CBC     Status: Abnormal   Collection Time: 04/13/19  4:25 AM  Result Value Ref Range   WBC 13.4 (H) 4.0 - 10.5 K/uL   RBC 4.23 4.22 - 5.81 MIL/uL   Hemoglobin 12.7 (L) 13.0 - 17.0 g/dL   HCT 60.438.0 (L) 54.039.0 - 98.152.0 %   MCV 89.8 80.0 - 100.0 fL   MCH 30.0 26.0 - 34.0 pg   MCHC 33.4 30.0 - 36.0 g/dL   RDW 19.112.6 47.811.5 - 29.515.5 %   Platelets 204 150 - 400 K/uL   nRBC 0.0 0.0 - 0.2 %    Comment: Performed at [facility], [address] [place], [place] [address]  HIV Antibody (routine testing w rflx)     Status: None   Collection Time: 04/13/19  4:25 AM  Result Value Ref Range   HIV Screen 4th Generation wRfx NON REACTIVE NON REACTIVE    Comment: Performed at [facility], [address] [place], [place] [address]  Lipid panel     Status: Abnormal   Collection Time: 04/13/19  4:25 AM  Result Value Ref Range   Cholesterol 143 0 - 200 mg/dL   Triglycerides 32 <469<150 mg/dL   HDL 39 (L) >62>40 mg/dL   Total CHOL/HDL Ratio 3.7 RATIO   VLDL 6 0 - 40 mg/dL   LDL Cholesterol 98 0 - 99 mg/dL    Comment:        Total Cholesterol/HDL:CHD Risk Coronary Heart Disease Risk Table                     Men   Women  1/2 Average Risk   3.4   3.3  Average Risk       5.0   4.4  2 X Average Risk   9.6   7.1  3 X Average Risk  23.4   11.0        Use the calculated Patient Ratio above and the CHD Risk Table to determine the patient's CHD Risk.        ATP III CLASSIFICATION (LDL):  <100     mg/dL   Optimal  [id number]  mg/dL   Near or Above                    Optimal  130-159  mg/dL   Borderline  [phone]  mg/dL   High  >027>190     mg/dL   Very High Performed at [facility], [address] [place], [place] [address]    CT ABDOMEN PELVIS W CONTRAST  Result Date: 04/13/2019 CLINICAL DATA:  Pancreatitis suspected EXAM: CT ABDOMEN AND PELVIS WITH CONTRAST TECHNIQUE: Multidetector CT imaging of the abdomen and  pelvis was performed using the standard protocol following bolus administration of intravenous contrast. CONTRAST:  100mL OMNIPAQUE IOHEXOL 300 MG/ML  SOLN COMPARISON:  Abdominal ultrasound 04/12/2019, CT abdomen pelvis 04/24/2013 FINDINGS: Lower chest: Atelectatic changes in the bases. Lung bases otherwise clear. Normal heart size. No pericardial effusion. Hepatobiliary: Normal hepatic attenuation. Smooth hepatic surface contour. No focal hepatic lesions. Slight prominence of the gallbladder wall may be related to underdistention. This few partially calcified gallstones are seen at the level of the gallbladder neck (3/27). Question a distal common bile duct filling defect (6/35, 3/38). Faint pericholecystic inflammation is noted near  the level of the cystic duct though unclear if this is related to the gallbladder versus secondary to inflammation more focally centered on the pancreas. Pancreas: There is mild edematous changes of the pancreatic head with focal peripancreatic inflammation. Uniform parenchymal enhancement. No pancreatic ductal dilatation is seen. No peripancreatic fluid collection. Spleen: Normal in size without focal abnormality. Multiple accessory splenules noted towards the hilum. Adrenals/Urinary Tract: Normal adrenal glands. Few fluid attenuation cysts are present in both kidneys, largest on the right measures 1.8 cm (3/32), largest on the left measures 11.8 cm (3/41). Additional subcentimeter hypoattenuating foci are too small to fully characterize on CT but are statistically likely benign. No worrisome renal lesions, urolithiasis or hydronephrosis. Kidneys enhance and excrete symmetrically. Urinary bladder is unremarkable. Stomach/Bowel: Distal esophagus stomach and duodenum are unremarkable. No small bowel dilatation or wall thickening. Postsurgical changes at the tip of the cecum likely reflect prior appendectomy. No colonic dilatation or wall thickening. Scattered colonic diverticula without  focal pericolonic inflammation to suggest diverticulitis. Vascular/Lymphatic: The aorta is normal caliber. No suspicious or enlarged lymph nodes in the included lymphatic chains. Reproductive: The prostate and seminal vesicles are unremarkable. Other: Prior low anterior abdominopelvic hernia mesh repair. No bowel containing hernias. No free fluid or free air in the abdomen or pelvis. Upper abdominal inflammatory changes, as detailed above. Musculoskeletal: No acute osseous abnormality or suspicious osseous lesion. IMPRESSION: 1. Mild edematous changes of the pancreatic head with focal peripancreatic inflammation. Uniform parenchymal enhancement. No peripancreatic fluid collection. Findings are suggestive of acute interstitial edematous pancreatitis. 2. Cholelithiasis with mild prominence gallbladder wall, nonspecific given underdistention. 3. Possible filling defect in the distal common bile duct. Correlate with LFTs and if there is concern for ductal obstruction, MRCP could be obtained. 4. Bilateral renal cysts. 5. Colonic diverticulosis without evidence of diverticulitis. 6. Prior low anterior abdominopelvic hernia mesh repair. No bowel containing hernias. Electronically Signed   By: Kreg Shropshire M.D.   On: 04/13/2019 01:28   US Abdomen Limited  Result Date: 04/12/2019 CLINICAL DATA:  Right upper quadrant pain EXAM: ULTRASOUND ABDOMEN LIMITED RIGHT UPPER QUADRANT COMPARISON:  None. FINDINGS: Gallbladder: Small layering stones are seen within the gallbladder fundus the largest measuring 6 mm. There appears to be mild prominence of the gallbladder wall measuring up to 3.6 mm. No sonographic Eulah Pont sign is seen. No pericholecystic fluid. Common bile duct: Diameter: 3.4 mm Liver: No focal lesion identified. Within normal limits in parenchymal echogenicity. Portal vein is patent on color Doppler imaging with normal direction of blood flow towards the liver. Other: None. IMPRESSION: Cholelithiasis with a mildly  prominent gallbladder wall, which could be due to under distension or biliary contraction. No definite evidence of acute cholecystitis. Electronically Signed   By: Jonna Clark M.D.   On: 04/12/2019 23:37      Assessment/Plan HTN Hx of alcohol abuse   Gallstone pancreatitis - Hx of pancreatitis back in June 2020, has had outpatient workup revealing cholelithiasis - CT today shows pancreatitis, cholelithiasis, possible filling defect in CBD - may need MRCP, will discuss with attending  - Lipase 1300 on admission, LFTs trending down slightly, Tbili 3.4 from 3.7 on admit - WBC 13.4 and patient is afebrile, exam not suggestive of cholecystitis, I think this is likely just a reactive leukocytosis from pancreatitis - if labs improving, MRCP negative, and pain continues to improve - likely plan for lap chole +/- IOC tomorrow  FEN: CLD, IVF VTE: SCDs, lovenox ID: IV merrem 2/8>>  Wells Guiles, PA-C  [facility] 04/13/2019, 10:48 AM Please see Amion for pager number during day hours 7:00am-4:30pm

## 2019-04-13 NOTE — Progress Notes (Signed)
Penicillin Allergy Assessment   Penicillin Allergy History I spoke with Mike Olson about his penicillin allergy this afternoon to clarify his history. His reaction was listed as unknown in the medical chart but when I asked him he told me his reaction happened many years ago when he was a baby. He does not remember what happened exactly. When I asked him if he has taken any penicillin or penicillin like antibiotics such as amoxicillin, or Augmentin since the reaction he says he has taken amoxicillin many times without any issues. Given he has tolerated amoxicillin on top of his already low risk history I have a very low suspicion he is truly penicillin allergic.   We will challenge him with a one time dose of amoxicillin and if he tolerates I will remove his allergy from the medical chart and notify his PCP and pharmacy so they can update their records. I have spoken to the nurse who will monitor him every 15 minutes for the hour after the dose is given and she will document any adverse reactions.   Plan:  - Amoxicillin 500mg  x1 dose - Vitals q15 mins for the hour after - If tolerates remove allergy  , PharmD PGY2 Infectious Disease Pharmacy Resident

## 2019-04-13 NOTE — Progress Notes (Signed)
Pharmacy Antibiotic Note  ABDULKAREEM Olson is a 54 y.o. male admitted on 04/12/2019 with intra-abdominal infection.  Pharmacy has been consulted for Merrem dosing.  Plan: Merrem 1g IV Q8H.  Height: 5\' 9"  (175.3 cm) Weight: 169 lb 1.5 oz (76.7 kg) IBW/kg (Calculated) : 70.7  Temp (24hrs), Avg:98.5 F (36.9 C), Min:98.5 F (36.9 C), Max:98.5 F (36.9 C)  Recent Labs  Lab 04/12/19 2156  WBC 16.6*  CREATININE 1.02    Estimated Creatinine Clearance: 84.7 mL/min (by C-G formula based on SCr of 1.02 mg/dL).    Allergies  Allergen Reactions  . Penicillins Other (See Comments)    unknown     Thank you for allowing pharmacy to be a part of this patient's care.  [id number], PharmD, BCPS  04/13/2019 12:50 AM

## 2019-04-13 NOTE — Progress Notes (Signed)
  PROGRESS NOTE  Patient admitted earlier this morning.   Brief hx: Patient is a 54 year old male with a past medical history of hypertension.  He has been having waxing and waning epigastric abdominal pain and had an outpatient surgical consultation for possible gallbladder removal scheduled.  Due to more constant pain starting 2/7, he sought care.  He had elevated liver enzymes, alkaline phosphatase, gallstones, and pancreatitis.  Pain is well controlled today.  No nausea.  General surgery was called this morning.  Appreciate their recommendations.  Sharlene Dory, DO [facility] 04/13/2019, 10:17 AM  Available via Epic secure chat 7am-7pm After these hours, please refer to coverage provider listed on [email or web address]

## 2019-04-13 NOTE — ED Notes (Signed)
REPORT GIVEN TO RN ON 5C 

## 2019-04-13 NOTE — H&P (Signed)
History and Physical    Mike Olson ILN:[id number] DOB: [birth year] DOA: 04/12/2019  PCP: Patient, No Pcp Per  Patient coming from: Home  I have personally briefly reviewed patient's old medical records in [place]  Chief Complaint: Abd pain  HPI: Mike Olson is a 54 y.o. male with medical history significant of HTN.  Patient was admitted to a hospital in June for pancreatitis he reports.  Later seen by PCP for [place] which showed gallstones.  He used to drink more heavily, but has only had approximately 2 beers since June; nothing recently.  Has been experiencing intermittent, waxing and waning epigastric abdominal pain radiating around to his right upper abdomen and back.  Usually takes Tylenol PM or ibuprofen for pain.  Tried a tablet of leftover hydrocodone today, but feels that it only has been "masking" his discomfort.  Continues to feel like there is a band around his abdomen.  Pain has been constant since this afternoon.  Symptoms associated with nausea.  He has had some intermittent emesis during the week associated with bouts of pain, but none today.  Denies any bowel changes.  Notes subjective fever, but no recorded temperatures over 100.4 F.  H/o Appendectomy in 2015 here at [facility].   ED Course: WBC 16k.  LUQ TTP.  LUQ US shows gallstones, CBD only 3.43m.  AST 188, ALT 330, Tbili 3.7, ALK 239.  Lipase not back yet, EDP called lab and apparently they are having to dilute blood to determine how high lipase is (presumably lipase is very elevated).  1L LR in ED.   Review of Systems: As per HPI, otherwise all review of systems negative.  Past Medical History:  Diagnosis Date  . Allergy   . PONV (postoperative nausea and vomiting)     Past Surgical History:  Procedure Laterality Date  . APPENDECTOMY    . HERNIA REPAIR    . KNEE SURGERY Left   . LAPAROSCOPIC APPENDECTOMY N/A 04/24/2013   Procedure: APPENDECTOMY LAPAROSCOPIC;  Surgeon: TEarnstine Regal MD;   Location: [facility] ORS;  Service: General;  Laterality: N/A;  . SHOULDER SURGERY       reports that he has never smoked. His smokeless tobacco use includes chew. He reports current alcohol use. He reports that he does not use drugs.  Allergies  Allergen Reactions  . Penicillins Other (See Comments)    unknown    Family History  Problem Relation Age of Onset  . Hypertension Father   . Hyperlipidemia Father   . Alzheimer's disease Father   . Parkinson's disease Father   . Arthritis Mother        RA     Prior to Admission medications   Medication Sig Start Date End Date Taking? Authorizing Provider  esomeprazole (NEXIUM) 40 MG capsule Take 1 capsule (40 mg total) by mouth daily. 10/23/18  Yes Sagardia, MInes Bloomer MD  olmesartan (BENICAR) 20 MG tablet Take 1 tablet (20 mg total) by mouth daily. 12/01/17  Yes WMancel Bale PA-C    Physical Exam: Vitals:   04/12/19 2146 04/12/19 2150  BP: (!) 133/115   Pulse: 94   Resp: 18   Temp: 98.5 F (36.9 C)   TempSrc: Oral   SpO2: 96%   Weight:  76.7 kg  Height:  5' 9"  (1.753 m)    Constitutional: NAD, calm, comfortable Eyes: PERRL, lids and conjunctivae normal ENMT: Mucous membranes are moist. Posterior pharynx clear of any exudate or lesions.Normal dentition.  Neck:  normal, supple, no masses, no thyromegaly Respiratory: clear to auscultation bilaterally, no wheezing, no crackles. Normal respiratory effort. No accessory muscle use.  Cardiovascular: Regular rate and rhythm, no murmurs / rubs / gallops. No extremity edema. 2+ pedal pulses. No carotid bruits.  Abdomen: LUQ and epigastric TTP with guarding Musculoskeletal: no clubbing / cyanosis. No joint deformity upper and lower extremities. Good ROM, no contractures. Normal muscle tone.  Skin: no rashes, lesions, ulcers. No induration Neurologic: CN 2-12 grossly intact. Sensation intact, DTR normal. Strength 5/5 in all 4.  Psychiatric: Normal judgment and insight. Alert and oriented  x 3. Normal mood.    Labs on Admission: I have personally reviewed following labs and imaging studies  CBC: Recent Labs  Lab 04/12/19 2156  WBC 16.6*  HGB 14.8  HCT 44.1  MCV 90.6  PLT 559   Basic Metabolic Panel: Recent Labs  Lab 04/12/19 2156  NA 140  K 3.5  CL 106  CO2 20*  GLUCOSE 120*  BUN 14  CREATININE 1.02  CALCIUM 9.3   GFR: Estimated Creatinine Clearance: 84.7 mL/min (by C-G formula based on SCr of 1.02 mg/dL). Liver Function Tests: Recent Labs  Lab 04/12/19 2156  AST 188*  ALT 330*  ALKPHOS 239*  BILITOT 3.7*  PROT 6.7  ALBUMIN 3.9   No results for input(s): LIPASE, AMYLASE in the last 168 hours. No results for input(s): AMMONIA in the last 168 hours. Coagulation Profile: No results for input(s): INR, PROTIME in the last 168 hours. Cardiac Enzymes: No results for input(s): CKTOTAL, CKMB, CKMBINDEX, TROPONINI in the last 168 hours. BNP (last 3 results) No results for input(s): PROBNP in the last 8760 hours. HbA1C: No results for input(s): HGBA1C in the last 72 hours. CBG: No results for input(s): GLUCAP in the last 168 hours. Lipid Profile: No results for input(s): CHOL, HDL, LDLCALC, TRIG, CHOLHDL, LDLDIRECT in the last 72 hours. Thyroid Function Tests: No results for input(s): TSH, T4TOTAL, FREET4, T3FREE, THYROIDAB in the last 72 hours. Anemia Panel: No results for input(s): VITAMINB12, FOLATE, FERRITIN, TIBC, IRON, RETICCTPCT in the last 72 hours. Urine analysis:    Component Value Date/Time   COLORURINE AMBER (A) 04/12/2019 2209   APPEARANCEUR CLEAR 04/12/2019 2209   APPEARANCEUR Clear 01/31/2017 1155   LABSPEC 1.025 04/12/2019 2209   PHURINE 5.0 04/12/2019 2209   GLUCOSEU NEGATIVE 04/12/2019 2209   HGBUR NEGATIVE 04/12/2019 2209   BILIRUBINUR SMALL (A) 04/12/2019 2209   BILIRUBINUR Negative 01/31/2017 1155   KETONESUR 80 (A) 04/12/2019 2209   PROTEINUR 30 (A) 04/12/2019 2209   UROBILINOGEN 0.2 04/24/2013 1642   NITRITE NEGATIVE  04/12/2019 2209   LEUKOCYTESUR NEGATIVE 04/12/2019 2209    Radiological Exams on Admission: US Abdomen Limited  Result Date: 04/12/2019 CLINICAL DATA:  Right upper quadrant pain EXAM: ULTRASOUND ABDOMEN LIMITED RIGHT UPPER QUADRANT COMPARISON:  None. FINDINGS: Gallbladder: Small layering stones are seen within the gallbladder fundus the largest measuring 6 mm. There appears to be mild prominence of the gallbladder wall measuring up to 3.6 mm. No sonographic Percell Miller sign is seen. No pericholecystic fluid. Common bile duct: Diameter: 3.4 mm Liver: No focal lesion identified. Within normal limits in parenchymal echogenicity. Portal vein is patent on color Doppler imaging with normal direction of blood flow towards the liver. Other: None. IMPRESSION: Cholelithiasis with a mildly prominent gallbladder wall, which could be due to under distension or biliary contraction. No definite evidence of acute cholecystitis. Electronically Signed   By: Ebony Cargo.D.  On: 04/12/2019 23:37    EKG: Independently reviewed.  Assessment/Plan Principal Problem:   Acute gallstone pancreatitis Active Problems:   Benign essential HTN    1. Acute gallstone pancreatitis - 1. IVF: 1L LR in ED + D5 half at 125 cc/hr 2. Empiric Merropenem for the moment 3. Check CT abd/pelvis to look for any complications associated with pancreatitis 4. Repeat CBC/CMP in AM, trend LFTs 1. [place] didn't suggest any duct obstruction, duct only 3.33m 5. Call gen surg for consult in AM - sooner if CT shows some complication. 6. Morphine PRN pain 2. HTN - 1. Hold Olmesartan  DVT prophylaxis: Lovenox Code Status: Full Family Communication: No family in room Disposition Plan: Home after admit Consults called: None, Call gen surg in AM Admission status: Admit to inpatient  Severity of Illness: The appropriate patient status for this patient is INPATIENT. Inpatient status is judged to be reasonable and necessary in order to provide the  required intensity of service to ensure the patient's safety. The patient's presenting symptoms, physical exam findings, and initial radiographic and laboratory data in the context of their chronic comorbidities is felt to place them at high risk for further clinical deterioration. Furthermore, it is not anticipated that the patient will be medically stable for discharge from the hospital within 2 midnights of admission. The following factors support the patient status of inpatient.   IP status due to acute pancreatitis with significant LFT elevations and WBC elevation.   * I certify that at the point of admission it is my clinical judgment that the patient will require inpatient hospital care spanning beyond 2 midnights from the point of admission due to high intensity of service, high risk for further deterioration and high frequency of surveillance required.*    Mike Olson M. DO [facility]  How to contact the T[facility] HospitalAttending or Consulting provider [place] covering provider during after hours [place] for this patient?  1. Check the care team in [facility]and look for a) attending/consulting TR[facility] provider listed and b) the TE[facility] Centerteam listed 2. Log into [email or web address]  Amion Physician Scheduling and messaging for groups and whole hospitals  On call and physician scheduling software for group practices, residents, hospitalists and other medical providers for call, clinic, rotation and shift schedules. OnCall Enterprise is a hospital-wide system for scheduling doctors and paging doctors on call. EasyPlot is for scientific plotting and data analysis.  [email or web address]  and use [place]'s universal password to access. If you do not have the password, please contact the hospital operator.  3. Locate the TTexas [facility] Surgery Center LLCprovider you are looking for under [facility] and page to a number that you can be directly reached. 4. If you still have difficulty reaching the provider, please page the [facility](Director  on Call) for the Hospitalists listed on amion for assistance.  04/13/2019, 1:02 AM

## 2019-04-14 ENCOUNTER — Inpatient Hospital Stay (HOSPITAL_COMMUNITY): Payer: BC Managed Care – PPO | Admitting: Anesthesiology

## 2019-04-14 ENCOUNTER — Encounter (HOSPITAL_COMMUNITY)
Admission: EM | Disposition: A | Discharge status: Home / Self Care | Payer: Self-pay | Source: Home / Self Care | Attending: Family Medicine

## 2019-04-14 ENCOUNTER — Encounter (HOSPITAL_COMMUNITY): Payer: Self-pay | Admitting: Internal Medicine

## 2019-04-14 DIAGNOSIS — K8066 Calculus of gallbladder and bile duct with acute and chronic cholecystitis without obstruction: Secondary | ICD-10-CM | POA: Diagnosis not present

## 2019-04-14 DIAGNOSIS — K851 Biliary acute pancreatitis without necrosis or infection: Secondary | ICD-10-CM | POA: Diagnosis not present

## 2019-04-14 DIAGNOSIS — J45909 Unspecified asthma, uncomplicated: Secondary | ICD-10-CM | POA: Diagnosis not present

## 2019-04-14 DIAGNOSIS — K8012 Calculus of gallbladder with acute and chronic cholecystitis without obstruction: Secondary | ICD-10-CM | POA: Diagnosis not present

## 2019-04-14 DIAGNOSIS — K811 Chronic cholecystitis: Secondary | ICD-10-CM | POA: Diagnosis not present

## 2019-04-14 DIAGNOSIS — I10 Essential (primary) hypertension: Secondary | ICD-10-CM | POA: Diagnosis not present

## 2019-04-14 DIAGNOSIS — K219 Gastro-esophageal reflux disease without esophagitis: Secondary | ICD-10-CM | POA: Diagnosis not present

## 2019-04-14 HISTORY — PX: CHOLECYSTECTOMY: SHX55

## 2019-04-14 LAB — CBC
HCT: 36.7 % — ABNORMAL LOW (ref 39.0–52.0)
Hemoglobin: 12.2 g/dL — ABNORMAL LOW (ref 13.0–17.0)
MCH: 30 pg (ref 26.0–34.0)
MCHC: 33.2 g/dL (ref 30.0–36.0)
MCV: 90.4 fL (ref 80.0–100.0)
Platelets: 206 10*3/uL (ref 150–400)
RBC: 4.06 MIL/uL — ABNORMAL LOW (ref 4.22–5.81)
RDW: 12.5 % (ref 11.5–15.5)
WBC: 5.9 10*3/uL (ref 4.0–10.5)
nRBC: 0 % (ref 0.0–0.2)

## 2019-04-14 LAB — COMPREHENSIVE METABOLIC PANEL
ALT: 168 U/L — ABNORMAL HIGH (ref 0–44)
AST: 48 U/L — ABNORMAL HIGH (ref 15–41)
Albumin: 2.9 g/dL — ABNORMAL LOW (ref 3.5–5.0)
Alkaline Phosphatase: 176 U/L — ABNORMAL HIGH (ref 38–126)
Anion gap: 8 (ref 5–15)
BUN: <5 mg/dL — ABNORMAL LOW (ref 6–20)
CO2: 27 mmol/L (ref 22–32)
Calcium: 8.5 mg/dL — ABNORMAL LOW (ref 8.9–10.3)
Chloride: 103 mmol/L (ref 98–111)
Creatinine, Ser: 0.86 mg/dL (ref 0.61–1.24)
GFR calc Af Amer: >60 mL/min (ref >60)
GFR calc non Af Amer: >60 mL/min (ref >60)
Glucose, Bld: 157 mg/dL — ABNORMAL HIGH (ref 70–99)
Potassium: 3.5 mmol/L (ref 3.5–5.1)
Sodium: 138 mmol/L (ref 135–145)
Total Bilirubin: 1.6 mg/dL — ABNORMAL HIGH (ref 0.3–1.2)
Total Protein: 5.3 g/dL — ABNORMAL LOW (ref 6.5–8.1)

## 2019-04-14 LAB — LIPASE, BLOOD: Lipase: 370 U/L — ABNORMAL HIGH (ref 11–51)

## 2019-04-14 LAB — SURGICAL PCR SCREEN
MRSA, PCR: NEGATIVE
Staphylococcus aureus: NEGATIVE

## 2019-04-14 SURGERY — LAPAROSCOPIC CHOLECYSTECTOMY
Anesthesia: General | Site: Abdomen

## 2019-04-14 MED ORDER — SUGAMMADEX SODIUM 200 MG/2ML IV SOLN
INTRAVENOUS | Status: DC | PRN
  Administered 2019-04-14: 200 mg via INTRAVENOUS

## 2019-04-14 MED ORDER — CEFAZOLIN SODIUM 1 G IJ SOLR
INTRAMUSCULAR | Status: AC
  Filled 2019-04-14: qty 20

## 2019-04-14 MED ORDER — 0.9 % SODIUM CHLORIDE (POUR BTL) OPTIME
TOPICAL | Status: DC | PRN
  Administered 2019-04-14: 10:00:00 1000 mL

## 2019-04-14 MED ORDER — ACETAMINOPHEN 500 MG PO TABS
1000.0000 mg | ORAL_TABLET | ORAL | Status: AC
  Administered 2019-04-14: 1000 mg via ORAL
  Filled 2019-04-14: qty 2

## 2019-04-14 MED ORDER — DEXAMETHASONE SODIUM PHOSPHATE 10 MG/ML IJ SOLN
INTRAMUSCULAR | Status: DC | PRN
  Administered 2019-04-14: 10 mg via INTRAVENOUS

## 2019-04-14 MED ORDER — ROCURONIUM BROMIDE 10 MG/ML (PF) SYRINGE
PREFILLED_SYRINGE | INTRAVENOUS | Status: AC
  Filled 2019-04-14: qty 10

## 2019-04-14 MED ORDER — LACTATED RINGERS IV SOLN: INTRAVENOUS | Status: DC

## 2019-04-14 MED ORDER — MIDAZOLAM HCL 2 MG/2ML IJ SOLN: 0.5000 mg | Freq: Once | INTRAMUSCULAR | Status: DC | PRN

## 2019-04-14 MED ORDER — PROMETHAZINE HCL 25 MG/ML IJ SOLN: 6.2500 mg | INTRAMUSCULAR | Status: DC | PRN

## 2019-04-14 MED ORDER — SODIUM CHLORIDE 0.9 % IR SOLN
Status: DC | PRN
  Administered 2019-04-14: 1000 mL

## 2019-04-14 MED ORDER — OXYCODONE HCL 5 MG PO TABS
5.0000 mg | ORAL_TABLET | ORAL | Status: DC | PRN
  Administered 2019-04-14 – 2019-04-15 (×3): 5 mg via ORAL
  Filled 2019-04-14 (×3): qty 1

## 2019-04-14 MED ORDER — DEXAMETHASONE SODIUM PHOSPHATE 10 MG/ML IJ SOLN
INTRAMUSCULAR | Status: AC
  Filled 2019-04-14: qty 1

## 2019-04-14 MED ORDER — ONDANSETRON HCL 4 MG/2ML IJ SOLN
INTRAMUSCULAR | Status: AC
  Filled 2019-04-14: qty 2

## 2019-04-14 MED ORDER — SCOPOLAMINE 1 MG/3DAYS TD PT72
1.0000 | MEDICATED_PATCH | TRANSDERMAL | Status: DC
  Administered 2019-04-14: 1.5 mg via TRANSDERMAL

## 2019-04-14 MED ORDER — HYDROMORPHONE HCL 1 MG/ML IJ SOLN
0.5000 mg | INTRAMUSCULAR | Status: AC | PRN
  Administered 2019-04-14 (×4): 0.5 mg via INTRAVENOUS

## 2019-04-14 MED ORDER — BUPIVACAINE-EPINEPHRINE 0.25% -1:200000 IJ SOLN
INTRAMUSCULAR | Status: DC | PRN
  Administered 2019-04-14: 10 mL

## 2019-04-14 MED ORDER — ONDANSETRON HCL 4 MG/2ML IJ SOLN
INTRAMUSCULAR | Status: DC | PRN
  Administered 2019-04-14: 4 mg via INTRAVENOUS

## 2019-04-14 MED ORDER — KETOROLAC TROMETHAMINE 15 MG/ML IJ SOLN
15.0000 mg | Freq: Three times a day (TID) | INTRAMUSCULAR | Status: DC | PRN
  Administered 2019-04-14 – 2019-04-15 (×3): 15 mg via INTRAVENOUS
  Filled 2019-04-14 (×3): qty 1

## 2019-04-14 MED ORDER — HYDROMORPHONE HCL 1 MG/ML IJ SOLN
0.2500 mg | INTRAMUSCULAR | Status: DC | PRN
  Administered 2019-04-14 (×4): 0.5 mg via INTRAVENOUS

## 2019-04-14 MED ORDER — HYDROMORPHONE HCL 1 MG/ML IJ SOLN
INTRAMUSCULAR | Status: AC
  Filled 2019-04-14: qty 1

## 2019-04-14 MED ORDER — SCOPOLAMINE 1 MG/3DAYS TD PT72
MEDICATED_PATCH | TRANSDERMAL | Status: AC
  Filled 2019-04-14: qty 1

## 2019-04-14 MED ORDER — DEXTROSE-NACL 5-0.45 % IV SOLN: INTRAVENOUS | Status: DC

## 2019-04-14 MED ORDER — LIDOCAINE 2% (20 MG/ML) 5 ML SYRINGE
INTRAMUSCULAR | Status: AC
  Filled 2019-04-14: qty 5

## 2019-04-14 MED ORDER — EPHEDRINE 5 MG/ML INJ
INTRAVENOUS | Status: AC
  Filled 2019-04-14: qty 10

## 2019-04-14 MED ORDER — STERILE WATER FOR IRRIGATION IR SOLN
Status: DC | PRN
  Administered 2019-04-14: 1000 mL

## 2019-04-14 MED ORDER — FENTANYL CITRATE (PF) 100 MCG/2ML IJ SOLN
INTRAMUSCULAR | Status: DC | PRN
  Administered 2019-04-14: 100 ug via INTRAVENOUS
  Administered 2019-04-14: 50 ug via INTRAVENOUS
  Administered 2019-04-14 (×2): 25 ug via INTRAVENOUS
  Administered 2019-04-14: 50 ug via INTRAVENOUS

## 2019-04-14 MED ORDER — LIDOCAINE 2% (20 MG/ML) 5 ML SYRINGE
INTRAMUSCULAR | Status: DC | PRN
  Administered 2019-04-14: 40 mg via INTRAVENOUS

## 2019-04-14 MED ORDER — CEFAZOLIN SODIUM-DEXTROSE 2-3 GM-%(50ML) IV SOLR
INTRAVENOUS | Status: DC | PRN
  Administered 2019-04-14: 2 g via INTRAVENOUS

## 2019-04-14 MED ORDER — BUPIVACAINE HCL (PF) 0.25 % IJ SOLN
INTRAMUSCULAR | Status: AC
  Filled 2019-04-14: qty 30

## 2019-04-14 MED ORDER — MEPERIDINE HCL 25 MG/ML IJ SOLN: 6.2500 mg | INTRAMUSCULAR | Status: DC | PRN

## 2019-04-14 MED ORDER — PROPOFOL 10 MG/ML IV BOLUS
INTRAVENOUS | Status: AC
  Filled 2019-04-14: qty 20

## 2019-04-14 MED ORDER — ROCURONIUM BROMIDE 10 MG/ML (PF) SYRINGE
PREFILLED_SYRINGE | INTRAVENOUS | Status: DC | PRN
  Administered 2019-04-14: 10 mg via INTRAVENOUS
  Administered 2019-04-14: 60 mg via INTRAVENOUS

## 2019-04-14 MED ORDER — FENTANYL CITRATE (PF) 250 MCG/5ML IJ SOLN
INTRAMUSCULAR | Status: AC
  Filled 2019-04-14: qty 5

## 2019-04-14 MED ORDER — MIDAZOLAM HCL 2 MG/2ML IJ SOLN
INTRAMUSCULAR | Status: AC
  Filled 2019-04-14: qty 2

## 2019-04-14 MED ORDER — MIDAZOLAM HCL 5 MG/5ML IJ SOLN
INTRAMUSCULAR | Status: DC | PRN
  Administered 2019-04-14: 2 mg via INTRAVENOUS

## 2019-04-14 MED ORDER — SODIUM CHLORIDE (PF) 0.9 % IJ SOLN
INTRAMUSCULAR | Status: AC
  Filled 2019-04-14: qty 10

## 2019-04-14 MED ORDER — EPHEDRINE SULFATE-NACL 50-0.9 MG/10ML-% IV SOSY
PREFILLED_SYRINGE | INTRAVENOUS | Status: DC | PRN
  Administered 2019-04-14: 10 mg via INTRAVENOUS

## 2019-04-14 MED ORDER — HEMOSTATIC AGENTS (NO CHARGE) OPTIME
TOPICAL | Status: DC | PRN
  Administered 2019-04-14 (×2): 1 via TOPICAL

## 2019-04-14 MED ORDER — PROPOFOL 10 MG/ML IV BOLUS
INTRAVENOUS | Status: DC | PRN
  Administered 2019-04-14: 150 mg via INTRAVENOUS

## 2019-04-14 SURGICAL SUPPLY — 42 items
ADH SKN CLS APL DERMABOND .7 (GAUZE/BANDAGES/DRESSINGS) ×2
APL PRP STRL LF DISP 70% ISPRP (MISCELLANEOUS) ×2
APPLIER CLIP 5 13 M/L LIGAMAX5 (MISCELLANEOUS) ×3
APR CLP MED LRG 5 ANG JAW (MISCELLANEOUS) ×2
BAG SPEC RTRVL 10 TROC 200 (ENDOMECHANICALS) ×2
BLADE CLIPPER SURG (BLADE) ×2 IMPLANT
CANISTER SUCT 3000ML PPV (MISCELLANEOUS) ×3 IMPLANT
CHLORAPREP W/TINT 26 (MISCELLANEOUS) ×3 IMPLANT
CLIP APPLIE 5 13 M/L LIGAMAX5 (MISCELLANEOUS) ×2 IMPLANT
COVER SURGICAL LIGHT HANDLE (MISCELLANEOUS) ×3 IMPLANT
COVER WAND RF STERILE (DRAPES) ×3 IMPLANT
DERMABOND ADVANCED (GAUZE/BANDAGES/DRESSINGS) ×1
DERMABOND ADVANCED .7 DNX12 (GAUZE/BANDAGES/DRESSINGS) ×2 IMPLANT
ELECT REM PT RETURN 9FT ADLT (ELECTROSURGICAL) ×3
ELECTRODE REM PT RTRN 9FT ADLT (ELECTROSURGICAL) ×2 IMPLANT
GLOVE BIO SURGEON STRL SZ7 (GLOVE) ×3 IMPLANT
GLOVE BIOGEL PI IND STRL 7.5 (GLOVE) ×3 IMPLANT
GLOVE BIOGEL PI INDICATOR 7.5 (GLOVE) ×2
GLOVE SURG SS PI 6.5 STRL IVOR (GLOVE) ×2 IMPLANT
GOWN STRL REUS W/ TWL LRG LVL3 (GOWN DISPOSABLE) ×8 IMPLANT
GOWN STRL REUS W/TWL LRG LVL3 (GOWN DISPOSABLE) ×15
GRASPER SUT TROCAR 14GX15 (MISCELLANEOUS) ×3 IMPLANT
HEMOSTAT SNOW SURGICEL 2X4 (HEMOSTASIS) ×4 IMPLANT
KIT BASIN OR (CUSTOM PROCEDURE TRAY) ×3 IMPLANT
KIT TURNOVER KIT B (KITS) ×3 IMPLANT
NS IRRIG 1000ML POUR BTL (IV SOLUTION) ×3 IMPLANT
PAD ARMBOARD 7.5X6 YLW CONV (MISCELLANEOUS) ×3 IMPLANT
POUCH RETRIEVAL ECOSAC 10 (ENDOMECHANICALS) ×2 IMPLANT
POUCH RETRIEVAL ECOSAC 10MM (ENDOMECHANICALS) ×1
SCISSORS LAP 5X35 DISP (ENDOMECHANICALS) ×3 IMPLANT
SET IRRIG TUBING LAPAROSCOPIC (IRRIGATION / IRRIGATOR) ×3 IMPLANT
SET TUBE SMOKE EVAC HIGH FLOW (TUBING) ×3 IMPLANT
SLEEVE ENDOPATH XCEL 5M (ENDOMECHANICALS) ×6 IMPLANT
SPECIMEN JAR SMALL (MISCELLANEOUS) ×3 IMPLANT
STRIP CLOSURE SKIN 1/2X4 (GAUZE/BANDAGES/DRESSINGS) ×3 IMPLANT
SUT MNCRL AB 4-0 PS2 18 (SUTURE) ×3 IMPLANT
SUT VICRYL 0 UR6 27IN ABS (SUTURE) ×3 IMPLANT
TOWEL GREEN STERILE FF (TOWEL DISPOSABLE) ×3 IMPLANT
TRAY LAPAROSCOPIC MC (CUSTOM PROCEDURE TRAY) ×3 IMPLANT
TROCAR XCEL BLUNT TIP 100MML (ENDOMECHANICALS) ×3 IMPLANT
TROCAR XCEL NON-BLD 5MMX100MML (ENDOMECHANICALS) ×3 IMPLANT
WATER STERILE IRR 1000ML POUR (IV SOLUTION) ×3 IMPLANT

## 2019-04-14 NOTE — Anesthesia Preprocedure Evaluation (Signed)
Anesthesia Evaluation  Patient identified by MRN, date of birth, ID band Patient awake    Reviewed: Allergy & Precautions, NPO status , Patient's Chart, lab work & pertinent test results  History of Anesthesia Complications (+) PONV  Airway Mallampati: I  TM Distance: >3 FB Neck ROM: Full    Dental  (+) Dental Advisory Given, Teeth Intact   Pulmonary neg pulmonary ROS,  04/12/2019 SARS coronavirus NEG   breath sounds clear to auscultation       Cardiovascular hypertension, Pt. on medications (-) angina Rhythm:Regular Rate:Normal     Neuro/Psych negative neurological ROS     GI/Hepatic GERD  Medicated and Controlled,Elevated LFTs with acute cholecystitis   Endo/Other  negative endocrine ROS  Renal/GU negative Renal ROS     Musculoskeletal   Abdominal   Peds  Hematology negative hematology ROS (+)   Anesthesia Other Findings   Reproductive/Obstetrics                             Anesthesia Physical Anesthesia Plan  ASA: II  Anesthesia Plan: General   Post-op Pain Management:    Induction: Intravenous  PONV Risk Score and Plan: 4 or greater and Ondansetron, Dexamethasone and Scopolamine patch - Pre-op  Airway Management Planned: Oral ETT  Additional Equipment: None  Intra-op Plan:   Post-operative Plan:   Informed Consent: I have reviewed the patients History and Physical, chart, labs and discussed the procedure including the risks, benefits and alternatives for the proposed anesthesia with the patient or authorized representative who has indicated his/her understanding and acceptance.     Dental advisory given  Plan Discussed with: CRNA and Surgeon  Anesthesia Plan Comments:         Anesthesia Quick Evaluation

## 2019-04-14 NOTE — Transfer of Care (Signed)
Immediate Anesthesia Transfer of Care Note  Patient: JAQUAVION MCCANNON  Procedure(s) Performed: LAPAROSCOPIC CHOLECYSTECTOMY (N/A Abdomen)  Patient Location: PACU  Anesthesia Type:General  Level of Consciousness: awake, alert  and oriented  Airway & Oxygen Therapy: Patient Spontanous Breathing and Patient connected to nasal cannula oxygen  Post-op Assessment: Report given to RN, Post -op Vital signs reviewed and stable and Patient moving all extremities  Post vital signs: Reviewed and stable  Last Vitals:  Vitals Value Taken Time  BP 132/81 04/14/19 1124  Temp    Pulse 73 04/14/19 1127  Resp 18 04/14/19 1127  SpO2 98 % 04/14/19 1127  Vitals shown include unvalidated device data.  Last Pain:  Vitals:   04/14/19 0730  TempSrc:   PainSc: 0-No pain         Complications: No apparent anesthesia complications

## 2019-04-14 NOTE — Progress Notes (Signed)
Patient ID: Mike Olson, male   DOB: May 03, 1965, 54 y.o.   MRN: [id number]       Subjective: Patient continues to feel better.  Abdominal pain improving.  No nausea or vomiting.  ROS: See above, otherwise other systems negative  Objective: Vital signs in last 24 hours: Temp:  [97.9 F (36.6 C)-98.2 F (36.8 C)] 98 F (36.7 C) (02/09 0602) Pulse Rate:  [50-83] 52 (02/09 0602) Resp:  [16-18] 17 (02/09 0602) BP: (101-112)/(60-77) 106/77 (02/09 0602) SpO2:  [92 %-98 %] 92 % (02/09 0602) Last BM Date: 04/12/19  Intake/Output from previous day: 02/08 0701 - 02/09 0700 In: 3328.6 [P.O.:480; I.V.:2568.3; IV Piggyback:280.3] Out: -  Intake/Output this shift: No intake/output data recorded.  PE: Heart: regular Lungs: CTAB Abd: soft, minimal epigastric tenderness, +BS, ND Ext: MAE, no cyanosis Skin: warm and dry, no lesions  Lab Results:  Recent Labs    04/13/19 0425 04/14/19 0628  WBC 13.4* 5.9  HGB 12.7* 12.2*  HCT 38.0* 36.7*  PLT 204 206   BMET Recent Labs    04/13/19 0425 04/14/19 0628  NA 137 138  K 4.0 3.5  CL 108 103  CO2 24 27  GLUCOSE 173* 157*  BUN 11 <5*  CREATININE 0.98 0.86  CALCIUM 8.2* 8.5*   PT/INR No results for input(s): LABPROT, INR in the last 72 hours. CMP     Component Value Date/Time   NA 138 04/14/2019 0628   NA 142 10/18/2017 1231   K 3.5 04/14/2019 0628   CL 103 04/14/2019 0628   CO2 27 04/14/2019 0628   GLUCOSE 157 (H) 04/14/2019 0628   BUN <5 (L) 04/14/2019 0628   BUN 13 10/18/2017 1231   CREATININE 0.86 04/14/2019 0628   CREATININE 0.82 12/30/2014 1642   CALCIUM 8.5 (L) 04/14/2019 0628   PROT 5.3 (L) 04/14/2019 0628   PROT 7.2 10/18/2017 1231   ALBUMIN 2.9 (L) 04/14/2019 0628   ALBUMIN 4.7 10/18/2017 1231   AST 48 (H) 04/14/2019 0628   ALT 168 (H) 04/14/2019 0628   ALKPHOS 176 (H) 04/14/2019 0628   BILITOT 1.6 (H) 04/14/2019 0628   BILITOT 0.7 10/18/2017 1231   GFRNONAA >60 04/14/2019 0628   GFRNONAA >89  12/30/2014 1642   GFRAA >60 04/14/2019 0628   GFRAA >89 12/30/2014 1642   Lipase     Component Value Date/Time   LIPASE 1,362 (H) 04/12/2019 2156       Studies/Results: CT ABDOMEN PELVIS W CONTRAST  Result Date: 04/13/2019 CLINICAL DATA:  Pancreatitis suspected EXAM: CT ABDOMEN AND PELVIS WITH CONTRAST TECHNIQUE: Multidetector CT imaging of the abdomen and pelvis was performed using the standard protocol following bolus administration of intravenous contrast. CONTRAST:  OMNIPAQUE IOHEXOL 300 MG/ML  SOLN COMPARISON:  Abdominal ultrasound 04/12/2019, CT abdomen pelvis 04/24/2013 FINDINGS: Lower chest: Atelectatic changes in the bases. Lung bases otherwise clear. Normal heart size. No pericardial effusion. Hepatobiliary: Normal hepatic attenuation. Smooth hepatic surface contour. No focal hepatic lesions. Slight prominence of the gallbladder wall may be related to underdistention. This few partially calcified gallstones are seen at the level of the gallbladder neck (3/27). Question a distal common bile duct filling defect (6/35, 3/38). Faint pericholecystic inflammation is noted near the level of the cystic duct though unclear if this is related to the gallbladder versus secondary to inflammation more focally centered on the pancreas. Pancreas: There is mild edematous changes of the pancreatic head with focal peripancreatic inflammation. Uniform parenchymal enhancement. No pancreatic ductal dilatation is  seen. No peripancreatic fluid collection. Spleen: Normal in size without focal abnormality. Multiple accessory splenules noted towards the hilum. Adrenals/Urinary Tract: Normal adrenal glands. Few fluid attenuation cysts are present in both kidneys, largest on the right measures 1.8 cm (3/32), largest on the left measures 11.8 cm (3/41). Additional subcentimeter hypoattenuating foci are too small to fully characterize on CT but are statistically likely benign. No worrisome renal lesions,  urolithiasis or hydronephrosis. Kidneys enhance and excrete symmetrically. Urinary bladder is unremarkable. Stomach/Bowel: Distal esophagus stomach and duodenum are unremarkable. No small bowel dilatation or wall thickening. Postsurgical changes at the tip of the cecum likely reflect prior appendectomy. No colonic dilatation or wall thickening. Scattered colonic diverticula without focal pericolonic inflammation to suggest diverticulitis. Vascular/Lymphatic: The aorta is normal caliber. No suspicious or enlarged lymph nodes in the included lymphatic chains. Reproductive: The prostate and seminal vesicles are unremarkable. Other: Prior low anterior abdominopelvic hernia mesh repair. No bowel containing hernias. No free fluid or free air in the abdomen or pelvis. Upper abdominal inflammatory changes, as detailed above. Musculoskeletal: No acute osseous abnormality or suspicious osseous lesion. IMPRESSION: 1. Mild edematous changes of the pancreatic head with focal peripancreatic inflammation. Uniform parenchymal enhancement. No peripancreatic fluid collection. Findings are suggestive of acute interstitial edematous pancreatitis. 2. Cholelithiasis with mild prominence gallbladder wall, nonspecific given underdistention. 3. Possible filling defect in the distal common bile duct. Correlate with LFTs and if there is concern for ductal obstruction, MRCP could be obtained. 4. Bilateral renal cysts. 5. Colonic diverticulosis without evidence of diverticulitis. 6. Prior low anterior abdominopelvic hernia mesh repair. No bowel containing hernias. Electronically Signed   By: Kreg Shropshire M.D.   On: 04/13/2019 01:28   US Abdomen Limited  Result Date: 04/12/2019 CLINICAL DATA:  Right upper quadrant pain EXAM: ULTRASOUND ABDOMEN LIMITED RIGHT UPPER QUADRANT COMPARISON:  None. FINDINGS: Gallbladder: Small layering stones are seen within the gallbladder fundus the largest measuring 6 mm. There appears to be mild prominence of the  gallbladder wall measuring up to 3.6 mm. No sonographic Eulah Pont sign is seen. No pericholecystic fluid. Common bile duct: Diameter: 3.4 mm Liver: No focal lesion identified. Within normal limits in parenchymal echogenicity. Portal vein is patent on color Doppler imaging with normal direction of blood flow towards the liver. Other: None. IMPRESSION: Cholelithiasis with a mildly prominent gallbladder wall, which could be due to under distension or biliary contraction. No definite evidence of acute cholecystitis. Electronically Signed   By: Jonna Clark M.D.   On: 04/12/2019 23:37    Anti-infectives: Anti-infectives (From admission, onward)   Start     Dose/Rate Route Frequency Ordered Stop   04/13/19 1630  amoxicillin (AMOXIL) capsule 500 mg     500 mg Oral  Once 04/13/19 1550 04/13/19 1653   04/13/19 0200  meropenem (MERREM) 1 g in sodium chloride 0.9 % 100 mL IVPB     1 g 200 mL/hr over 30 Minutes Intravenous Every 8 hours 04/13/19 0051         Assessment/Plan HTN Hx of alcohol abuse   Gallstone pancreatitis - labs improving today.  LFTs trending down and TB down to 1.7 -will proceed to OR today for lap chole with IOC -no need for further merrem from our standpoint due to elevated WBC likely just secondary to inflammatory response from pancreatitis and not due to infection. -I have explained the procedure, risks, and aftercare of cholecystectomy.  Risks include but are not limited to bleeding, infection, wound problems, anesthesia, diarrhea, bile leak,  injury to common bile duct/liver/intestine.  He seems to understand and agrees to proceed.   FEN: NPO, IVF VTE: SCDs, lovenox ID: IV merrem 2/8>>   LOS: 1 day    Henreitta Cea , [facility] 04/14/2019, 8:32 AM Please see Amion for pager number during day hours 7:00am-4:30pm or 7:00am -11:30am on weekends

## 2019-04-14 NOTE — Addendum Note (Signed)
Addendum  created 04/14/19 1213 by Jairo Ben, MD   Order list changed

## 2019-04-14 NOTE — Op Note (Signed)
Preoperative diagnosis: gallstone pancreatitis, resolved Postoperative diagnosis: Same as above Procedure: Laparoscopic cholecystectomy Surgeon: Dr. Harden Mo Anesthesia: General Estimated blood loss: 50 cc Complications: None Drains: None Specimens: Gallbladder and contents to pathology Sponge needle count was correct at completion Disposition to recovery stable condition  Indications: This is a 67 yom with gallstone pancreatitis that has resolved. I discussed going to OR for lap chole.   Procedure: After informed consent was obtained the patient was taken to the operating room.  He was given antibiotics.  SCDs were in place.  He was placed under general anesthesia without complication.  He was prepped and draped in the standard sterile surgical fashion.  Surgical timeout was then performed.  I filtrated Marcaine below the umbilicus and made a vertical incision.  I grasped the fascia and incised it sharply.  The peritoneum was entered bluntly.  There was no entry injury.  I then placed a 0 Vicryl pursestring suture and inserted a Hassan trocar.  The abdomen was then insufflated to 15 mmHg pressure.  I then inserted 3 further 5 mm trocars in the epigastrium and right side of the abdomen.  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  We retracted that cephalad and lateral.  There was a lot of inflammation in the triangle . Eventually I was able to identify the critical view of safety in the triangle of [place].  I then divided I then clipped and divided the cystic duct.  2 clips completely traversed the duct and the duct was viable.  I clipped the cystic artery and divided it leaving two clips in place.  I then took the gallbladder off of the liver bed.  The gallbladder was fused to the liver bed and I did enter into it.  I evacuated the stones and the contents.  I then removed the remainder of the gallbladder placed in a retrieval bag and took it out of his umbilicus.  I obtained  hemostasis.  I bovied the entire liver bed.  I did place some snow there.  My clips look like they were in good position.  I then removed my Hassan trocar and tied my pursestring down.  I placed an additional 0 Vicryl suture to completely obliterate this defect.  The incisions were closed with 4-0 Monocryl and glue.  He tolerated this well was extubated and transferred to recovery stable.

## 2019-04-14 NOTE — Progress Notes (Signed)
PROGRESS NOTE    Mike Olson  HUD:[id number] DOB: 06/27/1965 DOA: 04/12/2019 PCP: Mike Olson, No Pcp Per     Brief Narrative:  Mike Olson is a 54 year old male with a past medical history of hypertension.  He has been having waxing and waning epigastric abdominal pain and had an outpatient surgical consultation for possible gallbladder removal scheduled.  Due to more constant pain starting 2/7, he sought care prior to this evaluation.  He had elevated liver enzymes, alkaline phosphatase, gallstones, and pancreatitis.   New events last 24 hours / Subjective: The surgery team saw him yesterday.  Because his liver enzymes improved, they are taking him for a lap chole today.  His pain is around the same, no appetite.  Sometimes has nausea.  Denies fevers.  Assessment & Plan:   Principal Problem:   Acute gallstone pancreatitis  Appreciate surgery team  Lap chole and cholangiogram today  Postoperative diet per surgery recommendations   Morphine 2 to 4 mg every 4 hours as needed for severe pain  Zofran 4 mg every 6 hours as needed for nausea  D5 half-normal saline 125 mL/hour  Will stop Merrem today   Active Problems:   Benign essential HTN  Hold home meds  DVT prophylaxis: Lovenox Code Status: Full Family Communication: Self Disposition Plan: Coming from home, D/c home, barriers to discharge include recovery from surgery and potential ongoing pain   Consultants:   General surgery  Antimicrobials:  Anti-infectives (From admission, onward)   Start     Dose/Rate Route Frequency Ordered Stop   04/13/19 1630  amoxicillin (AMOXIL) capsule 500 mg     500 mg Oral  Once 04/13/19 1550 04/13/19 1653   04/13/19 0200  meropenem (MERREM) 1 g in sodium chloride 0.9 % 100 mL IVPB     1 g 200 mL/hr over 30 Minutes Intravenous Every 8 hours 04/13/19 0051          Objective: Vitals:   04/13/19 1653 04/13/19 1714 04/13/19 2338 04/14/19 0602  BP: 112/75 108/60 106/66 106/77  Pulse: 60  68 83 (!) 52  Resp: 16 18 16 17   Temp: 98 F (36.7 C) 98.2 F (36.8 C) 97.9 F (36.6 C) 98 F (36.7 C)  TempSrc:  Oral Oral Oral  SpO2: 98% 97% 93% 92%  Weight:      Height:        Intake/Output Summary (Last 24 hours) at 04/14/2019 0715 Last data filed at 04/14/2019 0300 Gross per 24 hour  Intake 3328.58 ml  Output --  Net 3328.58 ml   Filed Weights   04/12/19 2150  Weight: 76.7 kg    Examination:  General exam: Appears calm and comfortable  Respiratory system: Clear to auscultation. Respiratory effort normal. No respiratory distress. No conversational dyspnea.  Cardiovascular system: S1 & S2 heard, RRR. No murmurs. No pedal edema. Gastrointestinal system: Abdomen is nondistended, soft and ttp in epigastric region still, about the same as yesterday. Normal bowel sounds heard. Central nervous system: Alert and oriented. No focal neurological deficits. Speech clear.  Extremities: Symmetric in appearance  Skin: No rashes, lesions or ulcers on exposed skin  Psychiatry: Judgement and insight appear normal. Mood & affect appropriate.   Data Reviewed: I have personally reviewed following labs and imaging studies  CBC: Recent Labs  Lab 04/12/19 2156 04/13/19 0425 04/14/19 0628  WBC 16.6* 13.4* 5.9  HGB 14.8 12.7* 12.2*  HCT 44.1 38.0* 36.7*  MCV 90.6 89.8 90.4  PLT 258 204 206   Basic  Metabolic Panel: Recent Labs  Lab 04/12/19 2156 04/13/19 0425  NA 140 137  K 3.5 4.0  CL 106 108  CO2 20* 24  GLUCOSE 120* 173*  BUN 14 11  CREATININE 1.02 0.98  CALCIUM 9.3 8.2*   GFR: Estimated Creatinine Clearance: 88.2 mL/min (by C-G formula based on SCr of 0.98 mg/dL). Liver Function Tests: Recent Labs  Lab 04/12/19 2156 04/13/19 0425  AST 188* 130*  ALT 330* 251*  ALKPHOS 239* 198*  BILITOT 3.7* 3.4*  PROT 6.7 5.5*  ALBUMIN 3.9 3.1*   Recent Labs  Lab 04/12/19 2156  LIPASE 1,362*   Lipid Profile: Recent Labs    04/13/19 0425  CHOL 143  HDL 39*  LDLCALC  98  TRIG 32  CHOLHDL 3.7   Recent Results (from the past 240 hour(s))  Respiratory Panel by RT PCR (Flu A&B, Covid) - Nasopharyngeal Swab     Status: None   Collection Time: 04/12/19 11:11 PM   Specimen: Nasopharyngeal Swab  Result Value Ref Range Status   SARS Coronavirus 2 by RT PCR NEGATIVE NEGATIVE Final    Comment: (NOTE) SARS-CoV-2 target nucleic acids are NOT DETECTED. The SARS-CoV-2 RNA is generally detectable in upper respiratoy specimens during the acute phase of infection. The lowest concentration of SARS-CoV-2 viral copies this assay can detect is 131 copies/mL. A negative result does not preclude SARS-Cov-2 infection and should not be used as the sole basis for treatment or other Mike Olson management decisions. A negative result may occur with  improper specimen collection/handling, submission of specimen other than nasopharyngeal swab, presence of viral mutation(s) within the areas targeted by this assay, and inadequate number of viral copies (<131 copies/mL). A negative result must be combined with clinical observations, Mike Olson history, and epidemiological information. The expected result is Negative. Fact Sheet for Patients:  PinkCheek.be Fact Sheet for Healthcare Providers:  GravelBags.it This test is not yet ap proved or cleared by the [place] FDA and  has been authorized for detection and/or diagnosis of SARS-CoV-2 by FDA under an Emergency Use Authorization (EUA). This EUA will remain  in effect (meaning this test can be used) for the duration of the COVID-19 declaration under Section 564(b)(1) of the Act, 21 U.S.C. section 360bbb-3(b)(1), unless the authorization is terminated or revoked sooner.    Influenza A by PCR NEGATIVE NEGATIVE Final   Influenza B by PCR NEGATIVE NEGATIVE Final    Comment: (NOTE) The Xpert Xpress SARS-CoV-2/FLU/RSV assay is intended as an aid in  the diagnosis of  influenza from Nasopharyngeal swab specimens and  should not be used as a sole basis for treatment. Nasal washings and  aspirates are unacceptable for Xpert Xpress SARS-CoV-2/FLU/RSV  testing. Fact Sheet for Patients: PinkCheek.be Fact Sheet for Healthcare Providers: GravelBags.it This test is not yet approved or cleared by the [place] FDA and  has been authorized for detection and/or diagnosis of SARS-CoV-2 by  FDA under an Emergency Use Authorization (EUA). This EUA will remain  in effect (meaning this test can be used) for the duration of the  Covid-19 declaration under Section 564(b)(1) of the Act, 21  U.S.C. section 360bbb-3(b)(1), unless the authorization is  terminated or revoked. Performed at [facility], [place] [address], [place], [place] [address]       Radiology Studies: CT ABDOMEN PELVIS W CONTRAST  Result Date: 04/13/2019 CLINICAL DATA:  Pancreatitis suspected EXAM: CT ABDOMEN AND PELVIS WITH CONTRAST TECHNIQUE: Multidetector CT imaging of the abdomen and pelvis was performed  using the standard protocol following bolus administration of intravenous contrast. CONTRAST:  OMNIPAQUE IOHEXOL 300 MG/ML  SOLN COMPARISON:  Abdominal ultrasound 04/12/2019, CT abdomen pelvis 04/24/2013 FINDINGS: Lower chest: Atelectatic changes in the bases. Lung bases otherwise clear. Normal heart size. No pericardial effusion. Hepatobiliary: Normal hepatic attenuation. Smooth hepatic surface contour. No focal hepatic lesions. Slight prominence of the gallbladder wall may be related to underdistention. This few partially calcified gallstones are seen at the level of the gallbladder neck (3/27). Question a distal common bile duct filling defect (6/35, 3/38). Faint pericholecystic inflammation is noted near the level of the cystic duct though unclear if this is related to the gallbladder versus secondary to inflammation more focally  centered on the pancreas. Pancreas: There is mild edematous changes of the pancreatic head with focal peripancreatic inflammation. Uniform parenchymal enhancement. No pancreatic ductal dilatation is seen. No peripancreatic fluid collection. Spleen: Normal in size without focal abnormality. Multiple accessory splenules noted towards the hilum. Adrenals/Urinary Tract: Normal adrenal glands. Few fluid attenuation cysts are present in both kidneys, largest on the right measures 1.8 cm (3/32), largest on the left measures 11.8 cm (3/41). Additional subcentimeter hypoattenuating foci are too small to fully characterize on CT but are statistically likely benign. No worrisome renal lesions, urolithiasis or hydronephrosis. Kidneys enhance and excrete symmetrically. Urinary bladder is unremarkable. Stomach/Bowel: Distal esophagus stomach and duodenum are unremarkable. No small bowel dilatation or wall thickening. Postsurgical changes at the tip of the cecum likely reflect prior appendectomy. No colonic dilatation or wall thickening. Scattered colonic diverticula without focal pericolonic inflammation to suggest diverticulitis. Vascular/Lymphatic: The aorta is normal caliber. No suspicious or enlarged lymph nodes in the included lymphatic chains. Reproductive: The prostate and seminal vesicles are unremarkable. Other: Prior low anterior abdominopelvic hernia mesh repair. No bowel containing hernias. No free fluid or free air in the abdomen or pelvis. Upper abdominal inflammatory changes, as detailed above. Musculoskeletal: No acute osseous abnormality or suspicious osseous lesion. IMPRESSION: 1. Mild edematous changes of the pancreatic head with focal peripancreatic inflammation. Uniform parenchymal enhancement. No peripancreatic fluid collection. Findings are suggestive of acute interstitial edematous pancreatitis. 2. Cholelithiasis with mild prominence gallbladder wall, nonspecific given underdistention. 3. Possible filling  defect in the distal common bile duct. Correlate with LFTs and if there is concern for ductal obstruction, MRCP could be obtained. 4. Bilateral renal cysts. 5. Colonic diverticulosis without evidence of diverticulitis. 6. Prior low anterior abdominopelvic hernia mesh repair. No bowel containing hernias. Electronically Signed   By: Kreg Shropshire M.D.   On: 04/13/2019 01:28   US Abdomen Limited  Result Date: 04/12/2019 CLINICAL DATA:  Right upper quadrant pain EXAM: ULTRASOUND ABDOMEN LIMITED RIGHT UPPER QUADRANT COMPARISON:  None. FINDINGS: Gallbladder: Small layering stones are seen within the gallbladder fundus the largest measuring 6 mm. There appears to be mild prominence of the gallbladder wall measuring up to 3.6 mm. No sonographic Eulah Pont sign is seen. No pericholecystic fluid. Common bile duct: Diameter: 3.4 mm Liver: No focal lesion identified. Within normal limits in parenchymal echogenicity. Portal vein is patent on color Doppler imaging with normal direction of blood flow towards the liver. Other: None. IMPRESSION: Cholelithiasis with a mildly prominent gallbladder wall, which could be due to under distension or biliary contraction. No definite evidence of acute cholecystitis. Electronically Signed   By: Jonna Clark M.D.   On: 04/12/2019 23:37      Scheduled Meds:  enoxaparin (LOVENOX) injection  40 mg Subcutaneous Q24H   pantoprazole  80 mg  Oral Q1200   sodium chloride flush  3 mL Intravenous Once   Continuous Infusions:  dextrose 5 % and 0.45% NaCl 125 mL/hr at 04/14/19 0522   meropenem (MERREM) IV 1 g (04/14/19 0236)     LOS: 1 day      Time spent: 20 minutes   Sharlene Dory, DO [facility] 04/14/2019, 7:15 AM   Available via Epic secure chat 7am-7pm After these hours, please refer to coverage provider listed on [email or web address]

## 2019-04-14 NOTE — Anesthesia Postprocedure Evaluation (Signed)
Anesthesia Post Note  Patient: Mike Olson  Procedure(s) Performed: LAPAROSCOPIC CHOLECYSTECTOMY (N/A Abdomen)     Patient location during evaluation: PACU Anesthesia Type: General Level of consciousness: awake and alert, patient cooperative and oriented Pain control: pain improving. Vital Signs Assessment: post-procedure vital signs reviewed and stable Respiratory status: spontaneous breathing, nonlabored ventilation and respiratory function stable Cardiovascular status: blood pressure returned to baseline and stable Postop Assessment: no apparent nausea or vomiting Anesthetic complications: no    Last Vitals:  Vitals:   04/14/19 0602 04/14/19 1124  BP: 106/77 132/81  Pulse: (!) 52 76  Resp: 17 15  Temp: 36.7 C   SpO2: 92% 95%    Last Pain:  Vitals:   04/14/19 0730  TempSrc:   PainSc: 0-No pain                 Kaylla Cobos,E. Donyelle Enyeart

## 2019-04-14 NOTE — Anesthesia Procedure Notes (Signed)
Procedure Name: Intubation Date/Time: 04/14/2019 9:50 AM Performed by: Kyung Rudd, CRNA Pre-anesthesia Checklist: Patient identified, Emergency Drugs available, Suction available, Patient being monitored and Timeout performed Patient Re-evaluated:Patient Re-evaluated prior to induction Oxygen Delivery Method: Circle system utilized Preoxygenation: Pre-oxygenation with 100% oxygen Induction Type: IV induction Ventilation: Mask ventilation without difficulty Laryngoscope Size: Mac and 3 Grade View: Grade I Tube type: Oral Tube size: 7.5 mm Number of attempts: 1 Airway Equipment and Method: Stylet Placement Confirmation: ETT inserted through vocal cords under direct vision,  positive ETCO2 and breath sounds checked- equal and bilateral Secured at: 21 cm Tube secured with: Tape Dental Injury: Teeth and Oropharynx as per pre-operative assessment

## 2019-04-15 LAB — COMPREHENSIVE METABOLIC PANEL
ALT: 171 U/L — ABNORMAL HIGH (ref 0–44)
AST: 81 U/L — ABNORMAL HIGH (ref 15–41)
Albumin: 3.2 g/dL — ABNORMAL LOW (ref 3.5–5.0)
Alkaline Phosphatase: 161 U/L — ABNORMAL HIGH (ref 38–126)
Anion gap: 13 (ref 5–15)
BUN: 9 mg/dL (ref 6–20)
CO2: 25 mmol/L (ref 22–32)
Calcium: 9.3 mg/dL (ref 8.9–10.3)
Chloride: 102 mmol/L (ref 98–111)
Creatinine, Ser: 0.95 mg/dL (ref 0.61–1.24)
GFR calc Af Amer: >60 mL/min (ref >60)
GFR calc non Af Amer: >60 mL/min (ref >60)
Glucose, Bld: 115 mg/dL — ABNORMAL HIGH (ref 70–99)
Potassium: 4 mmol/L (ref 3.5–5.1)
Sodium: 140 mmol/L (ref 135–145)
Total Bilirubin: 1.4 mg/dL — ABNORMAL HIGH (ref 0.3–1.2)
Total Protein: 5.9 g/dL — ABNORMAL LOW (ref 6.5–8.1)

## 2019-04-15 LAB — SURGICAL PATHOLOGY

## 2019-04-15 MED ORDER — IBUPROFEN 200 MG PO TABS: 200.0000 mg | ORAL_TABLET | Freq: Three times a day (TID) | ORAL | Status: AC | PRN

## 2019-04-15 MED ORDER — ACETAMINOPHEN 500 MG PO TABS: 1000.0000 mg | ORAL_TABLET | Freq: Four times a day (QID) | ORAL | Status: AC | PRN

## 2019-04-15 MED ORDER — OXYCODONE HCL 10 MG PO TABS: 5.0000 mg | ORAL_TABLET | Freq: Four times a day (QID) | ORAL | 0 refills | Status: AC | PRN

## 2019-04-15 NOTE — Discharge Summary (Signed)
Physician Discharge Summary  Mike Olson OXB:[id number] DOB: 1965/08/04 DOA: 04/12/2019  PCP: Patient, No Pcp Per  Admit date: 04/12/2019 Discharge date: 04/15/2019  Admitted From: Home Disposition: Home  Recommendations for Outpatient Follow-up:  1. Follow up with PCP in 1 week 2. Follow up with general surgery in 2-3 weeks 3. Please obtain CMP/CBC in 1 week  Discharge Condition: Good CODE STATUS: Full Diet recommendation: General  Brief/Interim Summary: Patient is a 54 year old male with a past medical history of hypertension. He has been having waxing and waning epigastric abdominal pain and had an outpatient surgical consultation for possible gallbladder removal scheduled. Due to more constant pain starting 2/7, he sought care prior to this evaluation. He had elevated liver enzymes, alkaline phosphatase, gallstones, and pancreatitis.  The general surgery team saw the patient.  As his liver enzymes did improve, he was taken to the operating room for a laparoscopic cholecystectomy.  His pain was controlled and his diet advanced.  Subjective on day of discharge: Patient reports some pain and some nausea but controlled overall.  The surgery team is pleased with his progress.  Discharge Diagnoses:  Principal Problem:   Acute gallstone pancreatitis Active Problems:   Benign essential HTN  Discharge Instructions  Discharge Instructions    Call MD for:  persistant nausea and vomiting   Complete by: As directed    Call MD for:  redness, tenderness, or signs of infection (pain, swelling, redness, odor or green/yellow discharge around incision site)   Complete by: As directed    Call MD for:  severe uncontrolled pain   Complete by: As directed    Call MD for:  temperature >100.4   Complete by: As directed    Diet - low sodium heart healthy   Complete by: As directed    Increase activity slowly   Complete by: As directed      Allergies as of 04/15/2019   No Known  Allergies     Medication List    TAKE these medications   acetaminophen 500 MG tablet Commonly known as: TYLENOL Take 2 tablets (1,000 mg total) by mouth every 6 (six) hours as needed for mild pain (or Fever >/= 101).   esomeprazole 40 MG capsule Commonly known as: NEXIUM Take 1 capsule (40 mg total) by mouth daily.   ibuprofen 200 MG tablet Commonly known as: Motrin IB Take 1-4 tablets (200-800 mg total) by mouth every 8 (eight) hours as needed.   olmesartan 20 MG tablet Commonly known as: Benicar Take 1 tablet (20 mg total) by mouth daily.   Oxycodone HCl 10 MG Tabs Take 0.5-1 tablets (5-10 mg total) by mouth every 6 (six) hours as needed for moderate pain.      Follow-up Information    [facility], [facility] Follow up on 05/05/2019.   Specialty: General Surgery Why: 03/02 at 11 am.  This will be a telephone visit.  If you have questions or concerns regarding your wounds please take a picture and email it to [email or web address] Contact information: [address] [place] [address] [phone]          No Known Allergies  Consultations:  General surgery   Procedures/Studies: CT ABDOMEN PELVIS W CONTRAST  Result Date: 04/13/2019 CLINICAL DATA:  Pancreatitis suspected EXAM: CT ABDOMEN AND PELVIS WITH CONTRAST TECHNIQUE: Multidetector CT imaging of the abdomen and pelvis was performed using the standard protocol following bolus administration of intravenous contrast. CONTRAST:  OMNIPAQUE IOHEXOL 300 MG/ML  SOLN  COMPARISON:  Abdominal ultrasound 04/12/2019, CT abdomen pelvis 04/24/2013 FINDINGS: Lower chest: Atelectatic changes in the bases. Lung bases otherwise clear. Normal heart size. No pericardial effusion. Hepatobiliary: Normal hepatic attenuation. Smooth hepatic surface contour. No focal hepatic lesions. Slight prominence of the gallbladder wall may be related to underdistention. This few partially calcified gallstones are seen  at the level of the gallbladder neck (3/27). Question a distal common bile duct filling defect (6/35, 3/38). Faint pericholecystic inflammation is noted near the level of the cystic duct though unclear if this is related to the gallbladder versus secondary to inflammation more focally centered on the pancreas. Pancreas: There is mild edematous changes of the pancreatic head with focal peripancreatic inflammation. Uniform parenchymal enhancement. No pancreatic ductal dilatation is seen. No peripancreatic fluid collection. Spleen: Normal in size without focal abnormality. Multiple accessory splenules noted towards the hilum. Adrenals/Urinary Tract: Normal adrenal glands. Few fluid attenuation cysts are present in both kidneys, largest on the right measures 1.8 cm (3/32), largest on the left measures 11.8 cm (3/41). Additional subcentimeter hypoattenuating foci are too small to fully characterize on CT but are statistically likely benign. No worrisome renal lesions, urolithiasis or hydronephrosis. Kidneys enhance and excrete symmetrically. Urinary bladder is unremarkable. Stomach/Bowel: Distal esophagus stomach and duodenum are unremarkable. No small bowel dilatation or wall thickening. Postsurgical changes at the tip of the cecum likely reflect prior appendectomy. No colonic dilatation or wall thickening. Scattered colonic diverticula without focal pericolonic inflammation to suggest diverticulitis. Vascular/Lymphatic: The aorta is normal caliber. No suspicious or enlarged lymph nodes in the included lymphatic chains. Reproductive: The prostate and seminal vesicles are unremarkable. Other: Prior low anterior abdominopelvic hernia mesh repair. No bowel containing hernias. No free fluid or free air in the abdomen or pelvis. Upper abdominal inflammatory changes, as detailed above. Musculoskeletal: No acute osseous abnormality or suspicious osseous lesion. IMPRESSION: 1. Mild edematous changes of the pancreatic head with  focal peripancreatic inflammation. Uniform parenchymal enhancement. No peripancreatic fluid collection. Findings are suggestive of acute interstitial edematous pancreatitis. 2. Cholelithiasis with mild prominence gallbladder wall, nonspecific given underdistention. 3. Possible filling defect in the distal common bile duct. Correlate with LFTs and if there is concern for ductal obstruction, MRCP could be obtained. 4. Bilateral renal cysts. 5. Colonic diverticulosis without evidence of diverticulitis. 6. Prior low anterior abdominopelvic hernia mesh repair. No bowel containing hernias. Electronically Signed   By: Kreg Shropshire M.D.   On: 04/13/2019 01:28   US Abdomen Limited  Result Date: 04/12/2019 CLINICAL DATA:  Right upper quadrant pain EXAM: ULTRASOUND ABDOMEN LIMITED RIGHT UPPER QUADRANT COMPARISON:  None. FINDINGS: Gallbladder: Small layering stones are seen within the gallbladder fundus the largest measuring 6 mm. There appears to be mild prominence of the gallbladder wall measuring up to 3.6 mm. No sonographic Eulah Pont sign is seen. No pericholecystic fluid. Common bile duct: Diameter: 3.4 mm Liver: No focal lesion identified. Within normal limits in parenchymal echogenicity. Portal vein is patent on color Doppler imaging with normal direction of blood flow towards the liver. Other: None. IMPRESSION: Cholelithiasis with a mildly prominent gallbladder wall, which could be due to under distension or biliary contraction. No definite evidence of acute cholecystitis. Electronically Signed   By: Jonna Clark M.D.   On: 04/12/2019 23:37       Discharge Exam: Vitals:   04/15/19 0614 04/15/19 1048  BP: 121/83 114/75  Pulse: 62 66  Resp: 16 17  Temp: 98.5 F (36.9 C) 98.7 F (37.1 C)  SpO2: 93% 94%  General: Pt is alert, awake, not in acute distress Cardiovascular: RRR, S1/S2 +, no edema Respiratory: CTA bilaterally, no wheezing, no rhonchi, no respiratory distress, no conversational dyspnea   Abdominal: Incisions are clean, dry and intact Extremities: no edema, no cyanosis Psych: Normal mood and affect, stable judgement and insight     The results of significant diagnostics from this hospitalization (including imaging, microbiology, ancillary and laboratory) are listed below for reference.     Microbiology: Recent Results (from the past 240 hour(s))  Respiratory Panel by RT PCR (Flu A&B, Covid) - Nasopharyngeal Swab     Status: None   Collection Time: 04/12/19 11:11 PM   Specimen: Nasopharyngeal Swab  Result Value Ref Range Status   SARS Coronavirus 2 by RT PCR NEGATIVE NEGATIVE Final    Comment: (NOTE) SARS-CoV-2 target nucleic acids are NOT DETECTED. The SARS-CoV-2 RNA is generally detectable in upper respiratoy specimens during the acute phase of infection. The lowest concentration of SARS-CoV-2 viral copies this assay can detect is 131 copies/mL. A negative result does not preclude SARS-Cov-2 infection and should not be used as the sole basis for treatment or other patient management decisions. A negative result may occur with  improper specimen collection/handling, submission of specimen other than nasopharyngeal swab, presence of viral mutation(s) within the areas targeted by this assay, and inadequate number of viral copies (<131 copies/mL). A negative result must be combined with clinical observations, patient history, and epidemiological information. The expected result is Negative. Fact Sheet for Patients:  https://www.moore.com/ Fact Sheet for Healthcare Providers:  https://www.young.biz/ This test is not yet ap proved or cleared by the [place] FDA and  has been authorized for detection and/or diagnosis of SARS-CoV-2 by FDA under an Emergency Use Authorization (EUA). This EUA will remain  in effect (meaning this test can be used) for the duration of the COVID-19 declaration under Section 564(b)(1) of the Act,  21 U.S.C. section 360bbb-3(b)(1), unless the authorization is terminated or revoked sooner.    Influenza A by PCR NEGATIVE NEGATIVE Final   Influenza B by PCR NEGATIVE NEGATIVE Final    Comment: (NOTE) The Xpert Xpress SARS-CoV-2/FLU/RSV assay is intended as an aid in  the diagnosis of influenza from Nasopharyngeal swab specimens and  should not be used as a sole basis for treatment. Nasal washings and  aspirates are unacceptable for Xpert Xpress SARS-CoV-2/FLU/RSV  testing. Fact Sheet for Patients: https://www.moore.com/ Fact Sheet for Healthcare Providers: https://www.young.biz/ This test is not yet approved or cleared by the [place] FDA and  has been authorized for detection and/or diagnosis of SARS-CoV-2 by  FDA under an Emergency Use Authorization (EUA). This EUA will remain  in effect (meaning this test can be used) for the duration of the  Covid-19 declaration under Section 564(b)(1) of the Act, 21  U.S.C. section 360bbb-3(b)(1), unless the authorization is  terminated or revoked. Performed at [facility], [address] [place], [place] [address]   Surgical pcr screen     Status: None   Collection Time: 04/14/19  5:34 AM   Specimen: Nasal Mucosa; Nasal Swab  Result Value Ref Range Status   MRSA, PCR NEGATIVE NEGATIVE Final   Staphylococcus aureus NEGATIVE NEGATIVE Final    Comment: (NOTE) The Xpert SA Assay (FDA approved for NASAL specimens in patients 71 years of age and older), is one component of a comprehensive surveillance program. It is not intended to diagnose infection nor to guide or monitor treatment. Performed at [facility],  [address], [place], [place] [address]      Labs: Basic Metabolic Panel: Recent Labs  Lab 04/12/19 2156 04/13/19 0425 04/14/19 0628 04/15/19 0525  NA 140 137 138 140  K 3.5 4.0 3.5 4.0  CL 106 108 103 102  CO2 20* 24 27 25   GLUCOSE 120* 173* 157* 115*  BUN 14  11 <5* 9  CREATININE 1.02 0.98 0.86 0.95  CALCIUM 9.3 8.2* 8.5* 9.3   Liver Function Tests: Recent Labs  Lab 04/12/19 2156 04/13/19 0425 04/14/19 0628 04/15/19 0525  AST 188* 130* 48* 81*  ALT 330* 251* 168* 171*  ALKPHOS 239* 198* 176* 161*  BILITOT 3.7* 3.4* 1.6* 1.4*  PROT 6.7 5.5* 5.3* 5.9*  ALBUMIN 3.9 3.1* 2.9* 3.2*   Recent Labs  Lab 04/12/19 2156 04/14/19 0628  LIPASE 1,362* 370*   CBC: Recent Labs  Lab 04/12/19 2156 04/13/19 0425 04/14/19 0628  WBC 16.6* 13.4* 5.9  HGB 14.8 12.7* 12.2*  HCT 44.1 38.0* 36.7*  MCV 90.6 89.8 90.4  PLT 258 204 206   Lipid Profile Recent Labs    04/13/19 0425  CHOL 143  HDL 39*  LDLCALC 98  TRIG 32  CHOLHDL 3.7   Urinalysis    Component Value Date/Time   COLORURINE AMBER (A) 04/12/2019 2209   APPEARANCEUR CLEAR 04/12/2019 2209   APPEARANCEUR Clear 01/31/2017 1155   LABSPEC 1.025 04/12/2019 2209   PHURINE 5.0 04/12/2019 2209   GLUCOSEU NEGATIVE 04/12/2019 2209   HGBUR NEGATIVE 04/12/2019 2209   BILIRUBINUR SMALL (A) 04/12/2019 2209   BILIRUBINUR Negative 01/31/2017 1155   KETONESUR 80 (A) 04/12/2019 2209   PROTEINUR 30 (A) 04/12/2019 2209   UROBILINOGEN 0.2 04/24/2013 1642   NITRITE NEGATIVE 04/12/2019 2209   LEUKOCYTESUR NEGATIVE 04/12/2019 2209   Microbiology Recent Results (from the past 240 hour(s))  Respiratory Panel by RT PCR (Flu A&B, Covid) - Nasopharyngeal Swab     Status: None   Collection Time: 04/12/19 11:11 PM   Specimen: Nasopharyngeal Swab  Result Value Ref Range Status   SARS Coronavirus 2 by RT PCR NEGATIVE NEGATIVE Final    Comment: (NOTE) SARS-CoV-2 target nucleic acids are NOT DETECTED. The SARS-CoV-2 RNA is generally detectable in upper respiratoy specimens during the acute phase of infection. The lowest concentration of SARS-CoV-2 viral copies this assay can detect is 131 copies/mL. A negative result does not preclude SARS-Cov-2 infection and should not be used as the sole basis for  treatment or other patient management decisions. A negative result may occur with  improper specimen collection/handling, submission of specimen other than nasopharyngeal swab, presence of viral mutation(s) within the areas targeted by this assay, and inadequate number of viral copies (<131 copies/mL). A negative result must be combined with clinical observations, patient history, and epidemiological information. The expected result is Negative. Fact Sheet for Patients:  06/10/19 Fact Sheet for Healthcare Providers:  https://www.moore.com/ This test is not yet ap proved or cleared by the https://www.young.biz/ FDA and  has been authorized for detection and/or diagnosis of SARS-CoV-2 by FDA under an Emergency Use Authorization (EUA). This EUA will remain  in effect (meaning this test can be used) for the duration of the COVID-19 declaration under Section 564(b)(1) of the Act, 21 U.S.C. section 360bbb-3(b)(1), unless the authorization is terminated or revoked sooner.    Influenza A by PCR NEGATIVE NEGATIVE Final   Influenza B by PCR NEGATIVE NEGATIVE Final    Comment: (NOTE) The Xpert Xpress SARS-CoV-2/FLU/RSV assay is intended as  an aid in  the diagnosis of influenza from Nasopharyngeal swab specimens and  should not be used as a sole basis for treatment. Nasal washings and  aspirates are unacceptable for Xpert Xpress SARS-CoV-2/FLU/RSV  testing. Fact Sheet for Patients: PinkCheek.be Fact Sheet for Healthcare Providers: GravelBags.it This test is not yet approved or cleared by the [place] FDA and  has been authorized for detection and/or diagnosis of SARS-CoV-2 by  FDA under an Emergency Use Authorization (EUA). This EUA will remain  in effect (meaning this test can be used) for the duration of the  Covid-19 declaration under Section 564(b)(1) of the Act, 21  U.S.C. section  360bbb-3(b)(1), unless the authorization is  terminated or revoked. Performed at [facility], [place] [address] [place], [place] [address]   Surgical pcr screen     Status: None   Collection Time: 04/14/19  5:34 AM   Specimen: Nasal Mucosa; Nasal Swab  Result Value Ref Range Status   MRSA, PCR NEGATIVE NEGATIVE Final   Staphylococcus aureus NEGATIVE NEGATIVE Final    Comment: (NOTE) The Xpert SA Assay (FDA approved for NASAL specimens in patients 51 years of age and older), is one component of a comprehensive surveillance program. It is not intended to diagnose infection nor to guide or monitor treatment. Performed at [facility], [place] [address] [place], [place] [address]      Patient was seen and examined on the day of discharge and was found to be in stable condition. Time coordinating discharge: 35 minutes including assessment and coordination of care, as well as examination of the patient.   SIGNED:  Shelda Pal, DO [facility] 04/15/2019, 11:21 AM

## 2019-04-15 NOTE — Progress Notes (Signed)
Patient ID: Mike Olson, male   DOB: [birth year], 54 y.o.   MRN: [id number]    1 Day Post-Op  Subjective: Patient feels ok. Having some pain especially with a deep breath in his RUQ.  Tolerated his breakfast so far with no worsening pain or nausea.  toradol seems to help his pain the most.  ROS: See above, otherwise other systems negative  Objective: Vital signs in last 24 hours: Temp:  [98.3 F (36.8 C)-98.9 F (37.2 C)] 98.5 F (36.9 C) (02/10 0614) Pulse Rate:  [51-80] 62 (02/10 0614) Resp:  [15-22] 16 (02/10 0614) BP: (121-142)/(68-101) 121/83 (02/10 0614) SpO2:  [92 %-98 %] 93 % (02/10 0614) Last BM Date: 04/12/19  Intake/Output from previous day: 02/09 0701 - 02/10 0700 In: 750 [I.V.:750] Out: 75 [Blood:75] Intake/Output this shift: No intake/output data recorded.  PE: Abd: soft, appropriately tender, +BS, ND, incisions c/d/i  Lab Results:  Recent Labs    04/13/19 0425 04/14/19 0628  WBC 13.4* 5.9  HGB 12.7* 12.2*  HCT 38.0* 36.7*  PLT 204 206   BMET Recent Labs    04/14/19 0628 04/15/19 0525  NA 138 140  K 3.5 4.0  CL 103 102  CO2 27 25  GLUCOSE 157* 115*  BUN <5* 9  CREATININE 0.86 0.95  CALCIUM 8.5* 9.3   PT/INR No results for input(s): LABPROT, INR in the last 72 hours. CMP     Component Value Date/Time   NA 140 04/15/2019 0525   NA 142 10/18/2017 1231   K 4.0 04/15/2019 0525   CL 102 04/15/2019 0525   CO2 25 04/15/2019 0525   GLUCOSE 115 (H) 04/15/2019 0525   BUN 9 04/15/2019 0525   BUN 13 10/18/2017 1231   CREATININE 0.95 04/15/2019 0525   CREATININE 0.82 12/30/2014 1642   CALCIUM 9.3 04/15/2019 0525   PROT 5.9 (L) 04/15/2019 0525   PROT 7.2 10/18/2017 1231   ALBUMIN 3.2 (L) 04/15/2019 0525   ALBUMIN 4.7 10/18/2017 1231   AST 81 (H) 04/15/2019 0525   ALT 171 (H) 04/15/2019 0525   ALKPHOS 161 (H) 04/15/2019 0525   BILITOT 1.4 (H) 04/15/2019 0525   BILITOT 0.7 10/18/2017 1231   GFRNONAA >60 04/15/2019 0525   GFRNONAA >89  12/30/2014 1642   GFRAA >60 04/15/2019 0525   GFRAA >89 12/30/2014 1642   Lipase     Component Value Date/Time   LIPASE 370 (H) 04/14/2019 0272       Studies/Results: No results found.  Anti-infectives: Anti-infectives (From admission, onward)   Start     Dose/Rate Route Frequency Ordered Stop   04/13/19 1630  amoxicillin (AMOXIL) capsule 500 mg     500 mg Oral  Once 04/13/19 1550 04/13/19 1653   04/13/19 0200  meropenem (MERREM) 1 g in sodium chloride 0.9 % 100 mL IVPB  Status:  Discontinued     1 g 200 mL/hr over 30 Minutes Intravenous Every 8 hours 04/13/19 0051 04/14/19 0844       Assessment/Plan  HTN Hx of alcohol abuse   Gallstone pancreatitis - labs improving today.  -tolerating a diet and pain is control  -discussed follow up information and DC instructions with patient -he is surgically stable for DC home today.  D/W primary service as well. -all questions answered   FEN: regular diet VTE: SCDs, lovenox ID: IV merrem 2/8>>2/9   LOS: 2 days    Letha Cape , [facility] Surgery 04/15/2019, 10:20 AM Please see Amion for  pager number during day hours 7:00am-4:30pm or 7:00am -11:30am on weekends

## 2019-04-15 NOTE — Progress Notes (Signed)
Pt educated on discharge instructions. Pt verbalized understanding. Pt vitals WDL. Saline lock line removed. Pt is dressed. Transported via wheelchair to exit. Spouse picked patient up when discharged from facility.

## 2019-05-04 DIAGNOSIS — M7701 Medial epicondylitis, right elbow: Secondary | ICD-10-CM | POA: Diagnosis not present

## 2019-05-04 DIAGNOSIS — M25521 Pain in right elbow: Secondary | ICD-10-CM | POA: Diagnosis not present

## 2019-05-21 DIAGNOSIS — R509 Fever, unspecified: Secondary | ICD-10-CM | POA: Diagnosis not present

## 2019-05-21 DIAGNOSIS — Z20828 Contact with and (suspected) exposure to other viral communicable diseases: Secondary | ICD-10-CM | POA: Diagnosis not present

## 2019-05-21 DIAGNOSIS — Z20822 Contact with and (suspected) exposure to covid-19: Secondary | ICD-10-CM | POA: Diagnosis not present

## 2019-05-21 DIAGNOSIS — J069 Acute upper respiratory infection, unspecified: Secondary | ICD-10-CM | POA: Diagnosis not present

## 2019-05-26 DIAGNOSIS — R509 Fever, unspecified: Secondary | ICD-10-CM | POA: Diagnosis not present

## 2019-05-28 DIAGNOSIS — R5381 Other malaise: Secondary | ICD-10-CM | POA: Diagnosis not present

## 2019-05-28 DIAGNOSIS — R05 Cough: Secondary | ICD-10-CM | POA: Diagnosis not present

## 2019-05-28 DIAGNOSIS — R509 Fever, unspecified: Secondary | ICD-10-CM | POA: Diagnosis not present

## 2019-05-28 DIAGNOSIS — R748 Abnormal levels of other serum enzymes: Secondary | ICD-10-CM | POA: Diagnosis not present

## 2019-05-28 DIAGNOSIS — Z79899 Other long term (current) drug therapy: Secondary | ICD-10-CM | POA: Diagnosis not present

## 2019-05-28 DIAGNOSIS — R5383 Other fatigue: Secondary | ICD-10-CM | POA: Diagnosis not present

## 2019-05-29 ENCOUNTER — Encounter: Payer: Self-pay | Admitting: Adult Health Nurse Practitioner

## 2019-06-01 ENCOUNTER — Encounter: Payer: Self-pay | Admitting: Adult Health Nurse Practitioner

## 2019-07-23 DIAGNOSIS — R1011 Right upper quadrant pain: Secondary | ICD-10-CM | POA: Diagnosis not present

## 2019-07-23 DIAGNOSIS — Z6823 Body mass index (BMI) 23.0-23.9, adult: Secondary | ICD-10-CM | POA: Diagnosis not present

## 2019-07-23 DIAGNOSIS — R748 Abnormal levels of other serum enzymes: Secondary | ICD-10-CM | POA: Diagnosis not present

## 2019-07-23 DIAGNOSIS — K219 Gastro-esophageal reflux disease without esophagitis: Secondary | ICD-10-CM | POA: Diagnosis not present

## 2019-08-05 DIAGNOSIS — Z23 Encounter for immunization: Secondary | ICD-10-CM | POA: Diagnosis not present

## 2019-08-18 ENCOUNTER — Other Ambulatory Visit: Payer: Self-pay | Admitting: Sports Medicine

## 2019-08-18 DIAGNOSIS — M25521 Pain in right elbow: Secondary | ICD-10-CM

## 2019-08-20 ENCOUNTER — Other Ambulatory Visit: Payer: Self-pay

## 2019-08-20 ENCOUNTER — Ambulatory Visit
Admission: RE | Admit: 2019-08-20 | Discharge: 2019-08-20 | Disposition: A | Discharge status: Home / Self Care | Payer: BC Managed Care – PPO | Source: Ambulatory Visit | Attending: Sports Medicine | Admitting: Sports Medicine

## 2019-08-20 DIAGNOSIS — M25521 Pain in right elbow: Secondary | ICD-10-CM | POA: Diagnosis not present

## 2019-08-26 ENCOUNTER — Encounter: Payer: Self-pay | Admitting: Nurse Practitioner

## 2019-08-26 DIAGNOSIS — R1013 Epigastric pain: Secondary | ICD-10-CM | POA: Diagnosis not present

## 2019-09-03 DIAGNOSIS — L814 Other melanin hyperpigmentation: Secondary | ICD-10-CM | POA: Diagnosis not present

## 2019-09-03 DIAGNOSIS — L570 Actinic keratosis: Secondary | ICD-10-CM | POA: Diagnosis not present

## 2019-09-03 DIAGNOSIS — D229 Melanocytic nevi, unspecified: Secondary | ICD-10-CM | POA: Diagnosis not present

## 2019-09-03 DIAGNOSIS — L821 Other seborrheic keratosis: Secondary | ICD-10-CM | POA: Diagnosis not present

## 2019-09-08 DIAGNOSIS — Z23 Encounter for immunization: Secondary | ICD-10-CM | POA: Diagnosis not present

## 2019-09-11 DIAGNOSIS — Z20822 Contact with and (suspected) exposure to covid-19: Secondary | ICD-10-CM | POA: Diagnosis not present

## 2019-09-18 DIAGNOSIS — Z79899 Other long term (current) drug therapy: Secondary | ICD-10-CM | POA: Diagnosis not present

## 2019-09-18 DIAGNOSIS — K2970 Gastritis, unspecified, without bleeding: Secondary | ICD-10-CM | POA: Diagnosis not present

## 2019-09-18 DIAGNOSIS — R1013 Epigastric pain: Secondary | ICD-10-CM | POA: Diagnosis not present

## 2019-09-18 DIAGNOSIS — K449 Diaphragmatic hernia without obstruction or gangrene: Secondary | ICD-10-CM | POA: Diagnosis not present

## 2019-09-18 DIAGNOSIS — I10 Essential (primary) hypertension: Secondary | ICD-10-CM | POA: Diagnosis not present

## 2019-12-23 DIAGNOSIS — J329 Chronic sinusitis, unspecified: Secondary | ICD-10-CM | POA: Diagnosis not present

## 2019-12-23 DIAGNOSIS — J40 Bronchitis, not specified as acute or chronic: Secondary | ICD-10-CM | POA: Diagnosis not present

## 2020-08-02 DIAGNOSIS — K591 Functional diarrhea: Secondary | ICD-10-CM | POA: Diagnosis not present

## 2020-08-02 DIAGNOSIS — K219 Gastro-esophageal reflux disease without esophagitis: Secondary | ICD-10-CM | POA: Diagnosis not present

## 2020-08-02 DIAGNOSIS — M7700 Medial epicondylitis, unspecified elbow: Secondary | ICD-10-CM | POA: Diagnosis not present

## 2020-08-02 DIAGNOSIS — I10 Essential (primary) hypertension: Secondary | ICD-10-CM | POA: Diagnosis not present

## 2020-09-08 DIAGNOSIS — L570 Actinic keratosis: Secondary | ICD-10-CM | POA: Diagnosis not present

## 2020-09-08 DIAGNOSIS — L821 Other seborrheic keratosis: Secondary | ICD-10-CM | POA: Diagnosis not present

## 2020-09-08 DIAGNOSIS — D229 Melanocytic nevi, unspecified: Secondary | ICD-10-CM | POA: Diagnosis not present

## 2020-09-08 DIAGNOSIS — L814 Other melanin hyperpigmentation: Secondary | ICD-10-CM | POA: Diagnosis not present

## 2021-05-01 DIAGNOSIS — R42 Dizziness and giddiness: Secondary | ICD-10-CM | POA: Diagnosis not present

## 2021-05-01 DIAGNOSIS — R030 Elevated blood-pressure reading, without diagnosis of hypertension: Secondary | ICD-10-CM | POA: Diagnosis not present

## 2021-05-30 DIAGNOSIS — Z131 Encounter for screening for diabetes mellitus: Secondary | ICD-10-CM | POA: Diagnosis not present

## 2021-05-30 DIAGNOSIS — I10 Essential (primary) hypertension: Secondary | ICD-10-CM | POA: Diagnosis not present

## 2021-05-30 DIAGNOSIS — E7800 Pure hypercholesterolemia, unspecified: Secondary | ICD-10-CM | POA: Diagnosis not present

## 2021-05-30 DIAGNOSIS — K219 Gastro-esophageal reflux disease without esophagitis: Secondary | ICD-10-CM | POA: Diagnosis not present

## 2021-05-30 DIAGNOSIS — J309 Allergic rhinitis, unspecified: Secondary | ICD-10-CM | POA: Diagnosis not present

## 2021-05-30 DIAGNOSIS — Z125 Encounter for screening for malignant neoplasm of prostate: Secondary | ICD-10-CM | POA: Diagnosis not present

## 2021-10-05 IMAGING — CT CT ABD-PELV W/ CM
2 of 5 series · 13 of 46 positions shown, 15 images · IV contrast (APPLIED)
Comparison: Abdominal ultrasound 04/12/2019, CT abdomen pelvis
04/24/2013

CLINICAL DATA: Pancreatitis suspected

EXAM:
CT ABDOMEN AND PELVIS WITH CONTRAST
TECHNIQUE: Multidetector CT imaging of the abdomen and pelvis was performed
using the standard protocol following bolus administration of
intravenous contrast.
CONTRAST:  100mL OMNIPAQUE IOHEXOL 300 MG/ML  SOLN

[Series 3: abdomen 5.0 · axial · 0.78mm/px · z∈[-471,-76]mm · 10 of 97 slices shown, 12 images]
[im 9/97  soft-tissue]
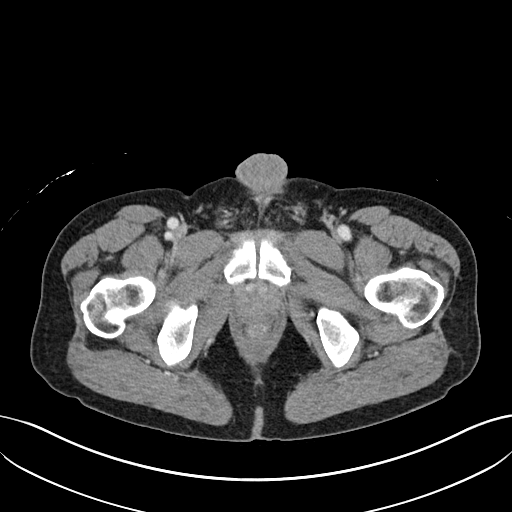
[im 9/97  bone]
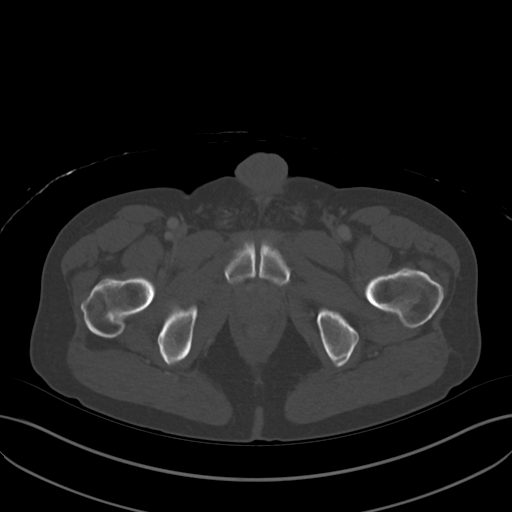
[im 18/97  soft-tissue]
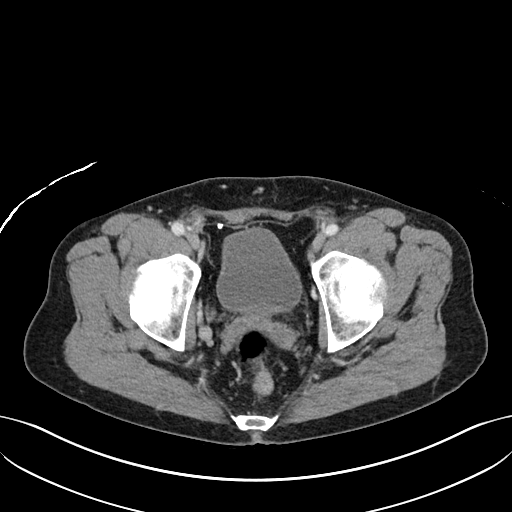
[im 27/97  soft-tissue]
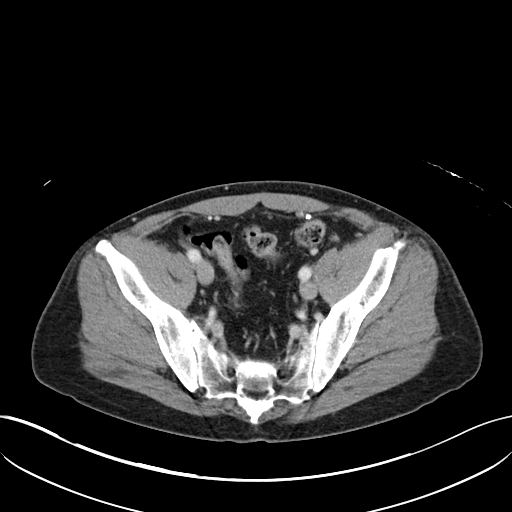
[im 35/97  soft-tissue]
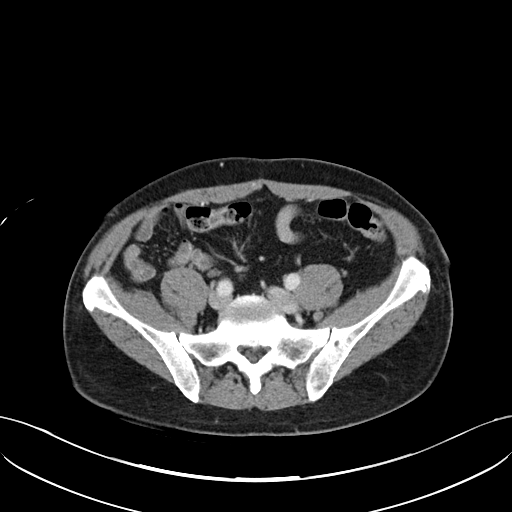
[im 44/97  soft-tissue]
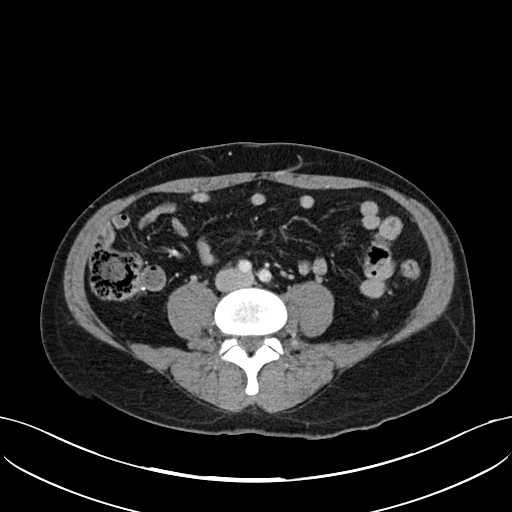
[im 53/97  soft-tissue]
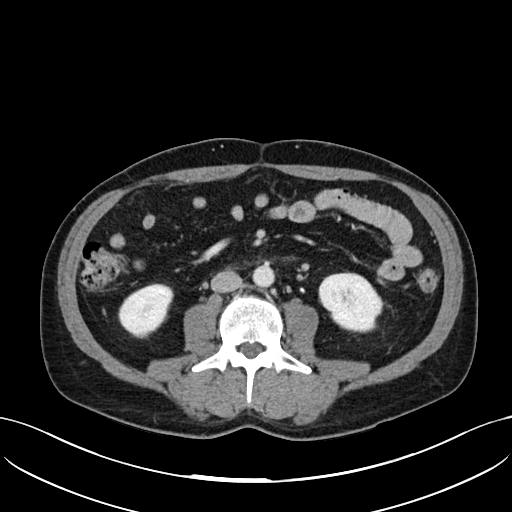
[im 62/97  soft-tissue]
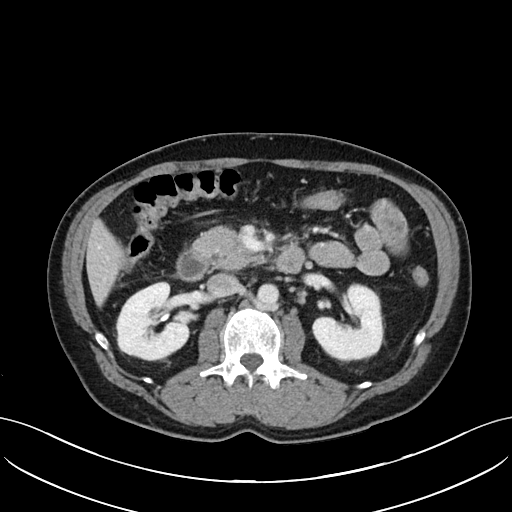
[im 70/97  soft-tissue]
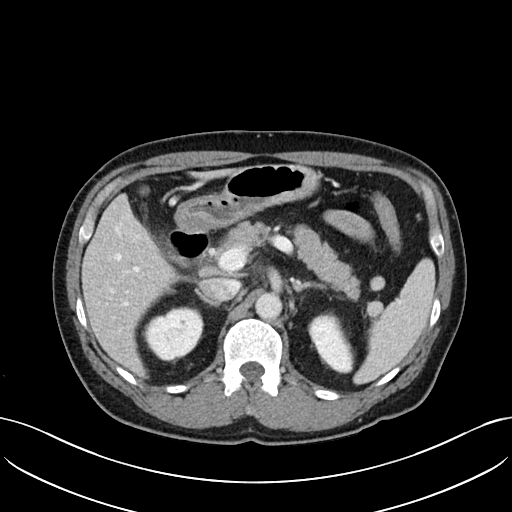
[im 79/97  soft-tissue]
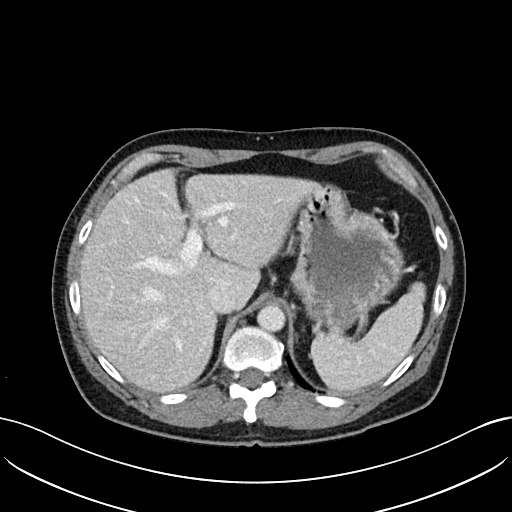
[im 79/97  bone]
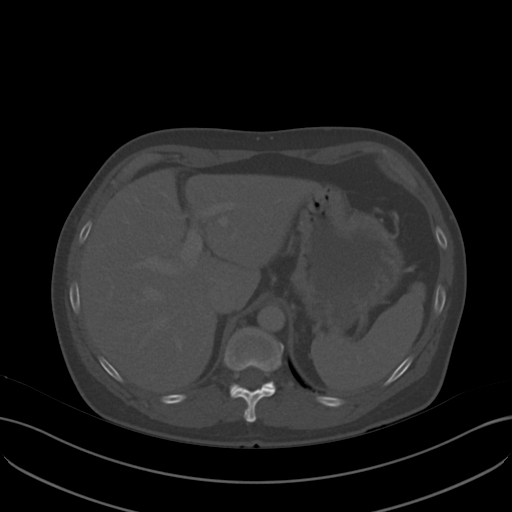
[im 88/97  soft-tissue]
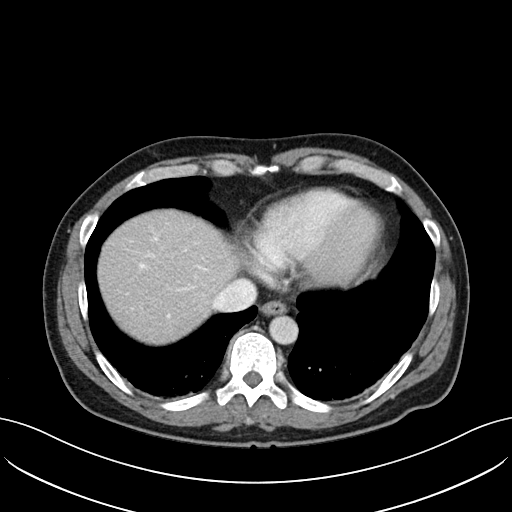

[Series 6: abdomen 3.0 mpr cor · coronal · 0.66mm/px · 3 of 84 slices shown]
[im 28/84  soft-tissue]
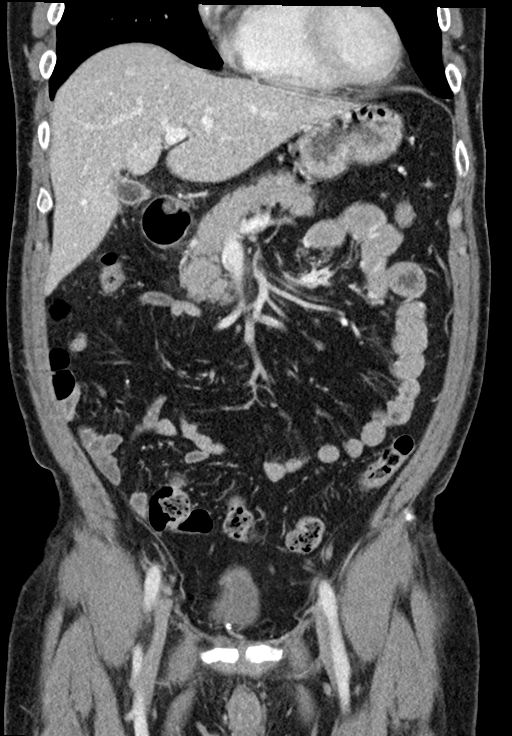
[im 37/84  soft-tissue]
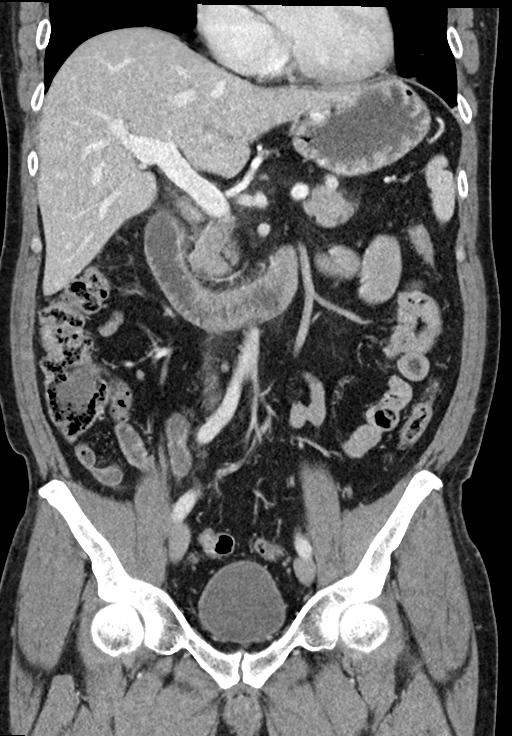
[im 47/84  soft-tissue]
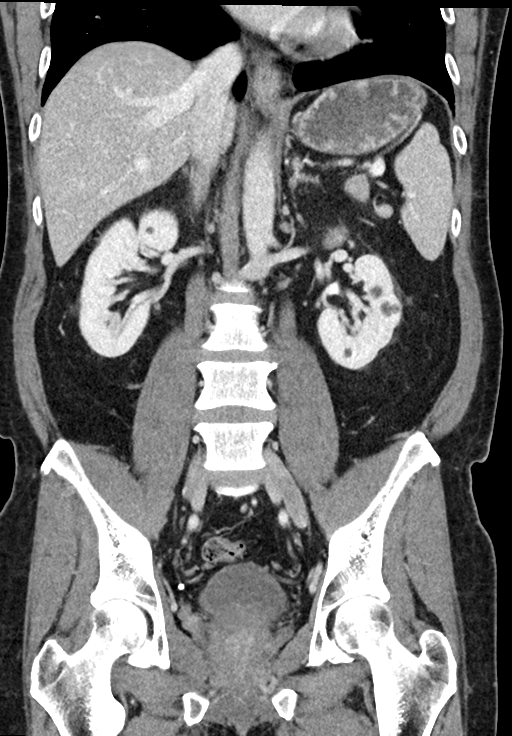

[13 of 46 positions shown; findings below may reference images not displayed]

FINDINGS: Lower chest: Atelectatic changes in the bases. Lung bases otherwise
clear. Normal heart size. No pericardial effusion.

Hepatobiliary: Normal hepatic attenuation. Smooth hepatic surface
contour. No focal hepatic lesions.

Slight prominence of the gallbladder wall may be related to
underdistention. This few partially calcified gallstones are seen at
the level of the gallbladder neck ([DATE]). Question a distal common
bile duct filling defect (6/35, 3/38). Faint pericholecystic
inflammation is noted near the level of the cystic duct though
unclear if this is related to the gallbladder versus secondary to
inflammation more focally centered on the pancreas.

Pancreas: There is mild edematous changes of the pancreatic head
with focal peripancreatic inflammation. Uniform parenchymal
enhancement. No pancreatic ductal dilatation is seen. No
peripancreatic fluid collection.

Spleen: Normal in size without focal abnormality. Multiple accessory
splenules noted towards the hilum.

Adrenals/Urinary Tract: Normal adrenal glands. Few fluid attenuation
cysts are present in both kidneys, largest on the right measures
cm (3/32), largest on the left measures 11.8 cm (3/41). Additional
subcentimeter hypoattenuating foci are too small to fully
characterize on CT but are statistically likely benign. No worrisome
renal lesions, urolithiasis or hydronephrosis. Kidneys enhance and
excrete symmetrically. Urinary bladder is unremarkable.

Stomach/Bowel: Distal esophagus stomach and duodenum are
unremarkable. No small bowel dilatation or wall thickening.
Postsurgical changes at the tip of the cecum likely reflect prior
appendectomy. No colonic dilatation or wall thickening. Scattered
colonic diverticula without focal pericolonic inflammation to
suggest diverticulitis.

Vascular/Lymphatic: The aorta is normal caliber. No suspicious or
enlarged lymph nodes in the included lymphatic chains.

Reproductive: The prostate and seminal vesicles are unremarkable.

Other: Prior low anterior abdominopelvic hernia mesh repair. No
bowel containing hernias. No free fluid or free air in the abdomen
or pelvis. Upper abdominal inflammatory changes, as detailed above.

Musculoskeletal: No acute osseous abnormality or suspicious osseous
lesion.
IMPRESSION: 1. Mild edematous changes of the pancreatic head with focal
peripancreatic inflammation. Uniform parenchymal enhancement. No
peripancreatic fluid collection. Findings are suggestive of acute
interstitial edematous pancreatitis.
2. Cholelithiasis with mild prominence gallbladder wall, nonspecific
given underdistention.
3. Possible filling defect in the distal common bile duct. Correlate
with LFTs and if there is concern for ductal obstruction, MRCP could
be obtained.
4. Bilateral renal cysts.
5. Colonic diverticulosis without evidence of diverticulitis.
6. Prior low anterior abdominopelvic hernia mesh repair. No bowel
containing hernias.

## 2021-10-09 DIAGNOSIS — L570 Actinic keratosis: Secondary | ICD-10-CM | POA: Diagnosis not present

## 2021-10-09 DIAGNOSIS — L814 Other melanin hyperpigmentation: Secondary | ICD-10-CM | POA: Diagnosis not present

## 2021-10-09 DIAGNOSIS — D229 Melanocytic nevi, unspecified: Secondary | ICD-10-CM | POA: Diagnosis not present

## 2021-10-09 DIAGNOSIS — D492 Neoplasm of unspecified behavior of bone, soft tissue, and skin: Secondary | ICD-10-CM | POA: Diagnosis not present

## 2021-10-09 DIAGNOSIS — L821 Other seborrheic keratosis: Secondary | ICD-10-CM | POA: Diagnosis not present

## 2021-10-09 DIAGNOSIS — D1801 Hemangioma of skin and subcutaneous tissue: Secondary | ICD-10-CM | POA: Diagnosis not present

## 2021-11-21 DIAGNOSIS — M546 Pain in thoracic spine: Secondary | ICD-10-CM | POA: Diagnosis not present

## 2021-11-21 DIAGNOSIS — G8929 Other chronic pain: Secondary | ICD-10-CM | POA: Diagnosis not present

## 2021-11-21 DIAGNOSIS — Z6825 Body mass index (BMI) 25.0-25.9, adult: Secondary | ICD-10-CM | POA: Diagnosis not present

## 2022-10-01 DIAGNOSIS — N529 Male erectile dysfunction, unspecified: Secondary | ICD-10-CM | POA: Diagnosis not present

## 2022-10-01 DIAGNOSIS — E7800 Pure hypercholesterolemia, unspecified: Secondary | ICD-10-CM | POA: Diagnosis not present

## 2022-10-01 DIAGNOSIS — Z79899 Other long term (current) drug therapy: Secondary | ICD-10-CM | POA: Diagnosis not present

## 2022-10-01 DIAGNOSIS — I10 Essential (primary) hypertension: Secondary | ICD-10-CM | POA: Diagnosis not present

## 2022-10-01 DIAGNOSIS — Z125 Encounter for screening for malignant neoplasm of prostate: Secondary | ICD-10-CM | POA: Diagnosis not present

## 2022-10-01 DIAGNOSIS — K219 Gastro-esophageal reflux disease without esophagitis: Secondary | ICD-10-CM | POA: Diagnosis not present

## 2023-04-10 DIAGNOSIS — L821 Other seborrheic keratosis: Secondary | ICD-10-CM | POA: Diagnosis not present

## 2023-04-10 DIAGNOSIS — L570 Actinic keratosis: Secondary | ICD-10-CM | POA: Diagnosis not present

## 2023-04-10 DIAGNOSIS — L814 Other melanin hyperpigmentation: Secondary | ICD-10-CM | POA: Diagnosis not present

## 2023-04-10 DIAGNOSIS — D225 Melanocytic nevi of trunk: Secondary | ICD-10-CM | POA: Diagnosis not present

## 2023-04-10 DIAGNOSIS — L578 Other skin changes due to chronic exposure to nonionizing radiation: Secondary | ICD-10-CM | POA: Diagnosis not present

## 2023-04-22 DIAGNOSIS — R972 Elevated prostate specific antigen [PSA]: Secondary | ICD-10-CM | POA: Diagnosis not present

## 2023-07-08 DIAGNOSIS — R972 Elevated prostate specific antigen [PSA]: Secondary | ICD-10-CM | POA: Diagnosis not present

## 2023-07-15 DIAGNOSIS — N402 Nodular prostate without lower urinary tract symptoms: Secondary | ICD-10-CM | POA: Diagnosis not present

## 2023-07-15 DIAGNOSIS — R972 Elevated prostate specific antigen [PSA]: Secondary | ICD-10-CM | POA: Diagnosis not present

## 2023-10-03 DIAGNOSIS — Z6824 Body mass index (BMI) 24.0-24.9, adult: Secondary | ICD-10-CM | POA: Diagnosis not present

## 2023-10-03 DIAGNOSIS — I10 Essential (primary) hypertension: Secondary | ICD-10-CM | POA: Diagnosis not present

## 2023-10-03 DIAGNOSIS — D171 Benign lipomatous neoplasm of skin and subcutaneous tissue of trunk: Secondary | ICD-10-CM | POA: Diagnosis not present

## 2023-11-06 DIAGNOSIS — D171 Benign lipomatous neoplasm of skin and subcutaneous tissue of trunk: Secondary | ICD-10-CM | POA: Diagnosis not present

## 2023-11-06 DIAGNOSIS — K219 Gastro-esophageal reflux disease without esophagitis: Secondary | ICD-10-CM | POA: Diagnosis not present

## 2023-11-06 DIAGNOSIS — I10 Essential (primary) hypertension: Secondary | ICD-10-CM | POA: Diagnosis not present

## 2023-11-06 DIAGNOSIS — E7800 Pure hypercholesterolemia, unspecified: Secondary | ICD-10-CM | POA: Diagnosis not present

## 2023-11-12 DIAGNOSIS — D171 Benign lipomatous neoplasm of skin and subcutaneous tissue of trunk: Secondary | ICD-10-CM | POA: Diagnosis not present

## 2023-12-09 DIAGNOSIS — Z79899 Other long term (current) drug therapy: Secondary | ICD-10-CM | POA: Diagnosis not present

## 2023-12-09 DIAGNOSIS — D179 Benign lipomatous neoplasm, unspecified: Secondary | ICD-10-CM | POA: Diagnosis not present

## 2023-12-09 DIAGNOSIS — M546 Pain in thoracic spine: Secondary | ICD-10-CM | POA: Diagnosis not present

## 2023-12-09 DIAGNOSIS — Z8719 Personal history of other diseases of the digestive system: Secondary | ICD-10-CM | POA: Diagnosis not present

## 2023-12-09 DIAGNOSIS — E7800 Pure hypercholesterolemia, unspecified: Secondary | ICD-10-CM | POA: Diagnosis not present

## 2023-12-09 DIAGNOSIS — J309 Allergic rhinitis, unspecified: Secondary | ICD-10-CM | POA: Diagnosis not present

## 2023-12-09 DIAGNOSIS — N529 Male erectile dysfunction, unspecified: Secondary | ICD-10-CM | POA: Diagnosis not present

## 2023-12-09 DIAGNOSIS — G8929 Other chronic pain: Secondary | ICD-10-CM | POA: Diagnosis not present

## 2023-12-09 DIAGNOSIS — I10 Essential (primary) hypertension: Secondary | ICD-10-CM | POA: Diagnosis not present

## 2023-12-09 DIAGNOSIS — D171 Benign lipomatous neoplasm of skin and subcutaneous tissue of trunk: Secondary | ICD-10-CM | POA: Diagnosis not present

## 2023-12-09 DIAGNOSIS — K219 Gastro-esophageal reflux disease without esophagitis: Secondary | ICD-10-CM | POA: Diagnosis not present
